# Patient Record
Sex: Male | Born: 1990 | Race: White | Hispanic: No | Marital: Married | Smoking: Former smoker
Health system: Southern US, Community
[De-identification: ages and names within clinical notes are randomized; demographics above are authoritative.]

## PROBLEM LIST (undated history)

## (undated) DIAGNOSIS — F419 Anxiety disorder, unspecified: Secondary | ICD-10-CM

## (undated) HISTORY — PX: ELBOW SURGERY: SHX618

---

## 2015-05-15 ENCOUNTER — Emergency Department (HOSPITAL_COMMUNITY)
Admission: EM | Admit: 2015-05-15 | Discharge: 2015-05-15 | Disposition: A | Discharge status: Home / Self Care | Payer: 59 | Attending: Emergency Medicine | Admitting: Emergency Medicine

## 2015-05-15 ENCOUNTER — Encounter (HOSPITAL_COMMUNITY): Payer: Self-pay | Admitting: *Deleted

## 2015-05-15 ENCOUNTER — Telehealth: Payer: Self-pay | Admitting: Cardiology

## 2015-05-15 ENCOUNTER — Emergency Department (HOSPITAL_COMMUNITY): Payer: 59

## 2015-05-15 DIAGNOSIS — R0602 Shortness of breath: Secondary | ICD-10-CM | POA: Diagnosis not present

## 2015-05-15 DIAGNOSIS — R079 Chest pain, unspecified: Secondary | ICD-10-CM | POA: Insufficient documentation

## 2015-05-15 DIAGNOSIS — F419 Anxiety disorder, unspecified: Secondary | ICD-10-CM | POA: Diagnosis not present

## 2015-05-15 DIAGNOSIS — R002 Palpitations: Secondary | ICD-10-CM | POA: Insufficient documentation

## 2015-05-15 HISTORY — DX: Anxiety disorder, unspecified: F41.9

## 2015-05-15 LAB — BASIC METABOLIC PANEL
Anion gap: 5 (ref 5–15)
BUN: 12 mg/dL (ref 6–20)
CALCIUM: 9.2 mg/dL (ref 8.9–10.3)
CO2: 27 mmol/L (ref 22–32)
CREATININE: 0.84 mg/dL (ref 0.61–1.24)
Chloride: 104 mmol/L (ref 101–111)
GFR calc Af Amer: >60 mL/min (ref >60)
GLUCOSE: 95 mg/dL (ref 65–99)
Potassium: 3.7 mmol/L (ref 3.5–5.1)
SODIUM: 136 mmol/L (ref 135–145)

## 2015-05-15 LAB — CBC
HEMATOCRIT: 43.7 % (ref 39.0–52.0)
Hemoglobin: 15.2 g/dL (ref 13.0–17.0)
MCH: 30.3 pg (ref 26.0–34.0)
MCHC: 34.8 g/dL (ref 30.0–36.0)
MCV: 87.2 fL (ref 78.0–100.0)
Platelets: 226 10*3/uL (ref 150–400)
RBC: 5.01 MIL/uL (ref 4.22–5.81)
RDW: 12.2 % (ref 11.5–15.5)
WBC: 7.2 10*3/uL (ref 4.0–10.5)

## 2015-05-15 LAB — I-STAT TROPONIN, ED: Troponin i, poc: 0 ng/mL (ref 0.00–0.08)

## 2015-05-15 NOTE — ED Notes (Signed)
Pt. Is stable, ambulatory, states understanding of discharge instruction.

## 2015-05-15 NOTE — Telephone Encounter (Signed)
Received records from [facility] for appointment on 05/18/15 with Dr Percival Spanish.  Records given to [facility] (medical records) for Dr Hochrein's schedule on 05/18/15. lp

## 2015-05-15 NOTE — ED Provider Notes (Signed)
CSN: [id number]     Arrival date & time 05/15/15  0355 History   First MD Initiated Contact with Patient 05/15/15 [phone]     Chief Complaint  Patient presents with  . Shortness of Breath  . Palpitations  . Chest Pain     (Consider location/radiation/quality/duration/timing/severity/associated sxs/prior Treatment) Patient is a 24 y.o. male presenting with shortness of breath, palpitations, and chest pain. The history is provided by the patient.  Shortness of Breath Associated symptoms: chest pain   Palpitations Associated symptoms: chest pain and shortness of breath   Chest Pain Associated symptoms: palpitations and shortness of breath   He states that as he is falling asleep, he feels like his heart is beating out of his chest. This is associated with a sense like he needs to belch but can't. This has occurred for the last 3 nights. He also states that it feels like his heart is about to fail. He does have a psychiatric history and his psychiatrist has told him that he can take diazepam as needed but has not taken it for the last 2 weeks. In past, he has had symptoms like this when he has used caffeine or alcohol, but he has abstained from these for the last 3 nights and symptoms are continuing. There is associated dyspnea, nausea. Denies diaphoresis. He is a nonsmoker and denies illicit drug use.  Past Medical History  Diagnosis Date  . Anxiety    Past Surgical History  Procedure Laterality Date  . Elbow surgery     No family history on file. History  Substance Use Topics  . Smoking status: Never Smoker   . Smokeless tobacco: Not on file  . Alcohol Use: Yes    Review of Systems  Respiratory: Positive for shortness of breath.   Cardiovascular: Positive for chest pain and palpitations.  All other systems reviewed and are negative.     Allergies  Review of patient's allergies indicates no known allergies.  Home Medications   Prior to Admission medications   Not on File    BP 137/72 mmHg  Pulse 67  Temp(Src) 98.4 F (36.9 C) (Oral)  Resp 19  Ht 6\' 2"  (1.88 m)  Wt 215 lb (97.523 kg)  BMI 27.59 kg/m2  SpO2 99% Physical Exam  Nursing note and vitals reviewed.  24 year old male, resting comfortably and in no acute distress. Vital signs are normal. Oxygen saturation is 99%, which is normal. Head is normocephalic and atraumatic. PERRLA, EOMI. Oropharynx is clear. Neck is nontender and supple without adenopathy or JVD. Back is nontender and there is no CVA tenderness. Lungs are clear without rales, wheezes, or rhonchi. Chest is nontender. Heart has regular rate and rhythm without murmur. Abdomen is soft, flat, nontender without masses or hepatosplenomegaly and peristalsis is normoactive. Extremities have no cyanosis or edema, full range of motion is present. Skin is warm and dry without rash. Neurologic: Mental status is normal, cranial nerves are intact, there are no motor or sensory deficits.  ED Course  Procedures (including critical care time) Labs Review Results for orders placed or performed during the hospital encounter of 32/67/12  Basic metabolic panel  Result Value Ref Range   Sodium 136 135 - 145 mmol/L   Potassium 3.7 3.5 - 5.1 mmol/L   Chloride 104 101 - 111 mmol/L   CO2 27 22 - 32 mmol/L   Glucose, Bld 95 65 - 99 mg/dL   BUN 12 6 - 20 mg/dL   Creatinine, Ser  0.84 0.61 - 1.24 mg/dL   Calcium 9.2 8.9 - 10.3 mg/dL   GFR calc non Af Amer >60 >60 mL/min   GFR calc Af Amer >60 >60 mL/min   Anion gap 5 5 - 15  CBC  Result Value Ref Range   WBC 7.2 4.0 - 10.5 K/uL   RBC 5.01 4.22 - 5.81 MIL/uL   Hemoglobin 15.2 13.0 - 17.0 g/dL   HCT 43.7 39.0 - 52.0 %   MCV 87.2 78.0 - 100.0 fL   MCH 30.3 26.0 - 34.0 pg   MCHC 34.8 30.0 - 36.0 g/dL   RDW 12.2 11.5 - 15.5 %   Platelets 226 150 - 400 K/uL  I-stat troponin, ED  Result Value Ref Range   Troponin i, poc 0.00 0.00 - 0.08 ng/mL   Comment 3           Imaging Review Dg Chest 2  View  05/15/2015   CLINICAL DATA:  Acute onset of generalized chest pain and palpitations. Initial encounter.  EXAM: CHEST  2 VIEW  COMPARISON:  None.  FINDINGS: The lungs are well-aerated. Mild vascular congestion is noted. There is no evidence of focal opacification, pleural effusion or pneumothorax.  The heart is normal in size; the mediastinal contour is within normal limits. No acute osseous abnormalities are seen.  IMPRESSION: Mild vascular congestion noted; lungs otherwise clear.   Electronically Signed   By: Garald Balding M.D.   On: 05/15/2015 04:58     EKG Interpretation   Date/Time:  Tuesday May 15 2015 04:10:13 EDT Ventricular Rate:  72 PR Interval:  180 QRS Duration: 98 QT Interval:  378 QTC Calculation: 413 R Axis:   99 Text Interpretation:  Normal sinus rhythm with sinus arrhythmia Rightward  axis ST elevation, consider early repolarization, pericarditis, or injury  Abnormal ECG No old tracing to compare Confirmed by [facility]  MD, Abdul Beirne  ([id number]) on 05/15/2015 4:22:22 AM      MDM   Final diagnoses:  Palpitations    Palpitations with on basically normal ECG and normal rhythm on monitor. This may be some version of night terrors. I discussed with patient that Holter monitor or event monitor would be best test to find out what his heart rhythm is when these episodes are actually happening. Will get screening labs in the ED but I doubt they will be informative.  As expected, laboratory workup is unremarkable. He is referred to cardiology for consideration for outpatient Holter or event monitor testing.``  Delora Fuel, MD 10/14/30 [id number]

## 2015-05-15 NOTE — ED Notes (Signed)
Water given with md authorization

## 2015-05-15 NOTE — ED Notes (Signed)
Patient states he has had chest pain, palpitations and sob for 3 nights.  Tonight he felt pressure in his chest like he needed to belch.  Patient states he was unable to rest.  Patient states for the past 3 nights he has not been able to rest like usual due to sx.  Patient states his pain was 10/10 earlier.  He has less pain now.  Patient states in the past he noted episodes related to caffiene activity, stress, etoh but he states he has not had any of the above in the past 3 nights.  Patient is alert.  Skin warm and dry.  Denies n/v/d.

## 2015-05-15 NOTE — Discharge Instructions (Signed)

## 2015-05-17 ENCOUNTER — Ambulatory Visit (INDEPENDENT_AMBULATORY_CARE_PROVIDER_SITE_OTHER): Payer: 59 | Admitting: Cardiology

## 2015-05-17 ENCOUNTER — Encounter: Payer: Self-pay | Admitting: Cardiology

## 2015-05-17 VITALS — BP 110/60 | HR 88 | Ht 74.0 in | Wt 215.2 lb

## 2015-05-17 DIAGNOSIS — R002 Palpitations: Secondary | ICD-10-CM

## 2015-05-17 LAB — TSH: TSH: 1.048 u[IU]/mL (ref 0.350–4.500)

## 2015-05-17 MED ORDER — PROPRANOLOL HCL 10 MG PO TABS: ORAL_TABLET | ORAL | Status: DC

## 2015-05-17 NOTE — Progress Notes (Signed)
Cardiology Office Note   Date:  05/17/2015   ID:  Kyle Ward, DOB [birth year], MRN [id number]  PCP:  No PCP Per Patient  Cardiologist:   Minus Breeding, MD   Chief Complaint  Patient presents with  . Palpitations  . Shortness of Breath      History of Present Illness: Kyle Ward is a 24 y.o. male who presents for evaluation of palpitations. He has had a workup past when he lived in [place] and I was able to review these records area in 2013 he had an echocardiogram which was normal. He's had a 24-hour Holter. She has a history of anxiety which is actively manage area is Holter demonstrated rare ECs but otherwise was unremarkable.  He was in the emergency room recently after having 3 nights of increased heart rate. He starts to go to sleep and he feels a rapid heart rate and a strong heartbeat. This was last at 12 hour period he finally went to the emergency room but by the time he was there his symptoms had abated. I reviewed emergency room records.  His labs were unremarkable.  He had no dysrhythmias.  He otherwise is active. He can physically exert himself doesn't exercise routinely without bringing on symptoms. He does not have chest pressure, neck or arm discomfort. He does not have palpitations, presyncope or syncope. He has no PND or orthopnea. He has no weight gain or edema. He does have continued significant problems with anxiety. He has been given Valium which she takes when necessary has been helping him sleep the last couple of nights. He's been given propranolol in the past but he didn't want to take it because he was afraid his heart might stop.   Past Medical History  Diagnosis Date  . Anxiety     Past Surgical History  Procedure Laterality Date  . Elbow surgery       Current Outpatient Prescriptions  Medication Sig Dispense Refill  . diazepam (VALIUM) 5 MG tablet Take 2.5-5 mg by mouth every 6 (six) hours as needed for anxiety.    . propranolol (INDERAL)  10 MG tablet ONE TABLET THREE TIMES DAILY AS NEEDED FOR PALPITATIONS 90 tablet 6   No current facility-administered medications for this visit.    Allergies:   Review of patient's allergies indicates no known allergies.    Social History:  The patient  reports that he has quit smoking. He does not have any smokeless tobacco history on file. He reports that he drinks alcohol. He reports that he does not use illicit drugs.   Family History:  The patient's family history includes Bipolar disorder in his paternal grandfather; Breast cancer in his mother; Liver disease in his father; Stroke in his maternal grandmother.    ROS:  Please see the history of present illness.   Otherwise, review of systems are positive for none.   All other systems are reviewed and negative.    PHYSICAL EXAM: VS:  BP 110/60 mmHg  Pulse 88  Ht 6\' 2"  (1.88 m)  Wt 215 lb 3.2 oz (97.614 kg)  BMI 27.62 kg/m2 , BMI Body mass index is 27.62 kg/(m^2). GENERAL:  Well appearing HEENT:  Pupils equal round and reactive, fundi not visualized, oral mucosa unremarkable NECK:  No jugular venous distention, waveform within normal limits, carotid upstroke brisk and symmetric, no bruits, no thyromegaly LYMPHATICS:  No cervical, inguinal adenopathy LUNGS:  Clear to auscultation bilaterally BACK:  No CVA tenderness CHEST:  Unremarkable HEART:  PMI not displaced or sustained,S1 and S2 within normal limits, no S3, no S4, no clicks, no rubs, no murmurs ABD:  Flat, positive bowel sounds normal in frequency in pitch, no bruits, no rebound, no guarding, no midline pulsatile mass, no hepatomegaly, no splenomegaly EXT:  2 plus pulses throughout, no edema, no cyanosis no clubbing SKIN:  No rashes no nodules NEURO:  Cranial nerves II through XII grossly intact, motor grossly intact throughout PSYCH:  Cognitively intact, oriented to person place and time    EKG:  EKG is not ordered today. The ekg ordered 05/15/11 demonstrates sinus  rhythm, rate 72, sinus arrhythmia, early repolarization pattern, no acute ST-T wave changes.   Recent Labs: 05/15/2015: BUN 12; Creatinine, Ser 0.84; Hemoglobin 15.2; Platelets 226; Potassium 3.7; Sodium 136    Lipid Panel No results found for: CHOL, TRIG, HDL, CHOLHDL, VLDL, LDLCALC, LDLDIRECT    Wt Readings from Last 3 Encounters:  05/17/15 215 lb 3.2 oz (97.614 kg)  05/15/15 215 lb (97.523 kg)      Other studies Reviewed: Additional studies/ records that were reviewed today include: ED records and previous cardiac work up. Review of the above records demonstrates:  Please see elsewhere in the note.    ASSESSMENT AND PLAN:  PALPITATIONS:  We had a very long discussion about this. 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 I don't strongly suspect a primary arrhythmia as the cause is he's had a complete workup of this in the past. He does note he has some triggers to include caffeine and sugar. We talked about avoiding these. I'm going to give him propranolol when necessary. We talked about management of his anxiety therapy which has previously been suggested. If he gets worse rather than better on going to see him back and has an event monitor or Holter placed. I will draw a TSH as well.   Current medicines are reviewed at length with the patient today.  The patient has concerns regarding medicines.  The following changes have been made:  As above  Labs/ tests ordered today include: None   Orders Placed This Encounter  Procedures  . TSH     Disposition:   FU with me in 2 months.     Signed, Minus Breeding, MD  05/17/2015 1:28 PM    [facility]

## 2015-05-17 NOTE — Patient Instructions (Addendum)
Your physician recommends that you schedule a follow-up appointment in: [address] [place] AS NEEDED FOR PALPITATIONS   Your physician recommends that you HAVE LAB WORK TODAY

## 2015-05-18 ENCOUNTER — Ambulatory Visit: Payer: 59 | Admitting: Cardiology

## 2015-05-29 ENCOUNTER — Ambulatory Visit: Payer: 59 | Admitting: Cardiology

## 2015-07-12 ENCOUNTER — Ambulatory Visit: Payer: 59 | Admitting: Cardiology

## 2015-11-27 IMAGING — CR DG CHEST 2V
2 series · 2 of 2 positions shown · non-contrast
Comparison: None.

CLINICAL DATA: Acute onset of generalized chest pain and
palpitations. Initial encounter.

EXAM:
CHEST  2 VIEW

[chest pa]
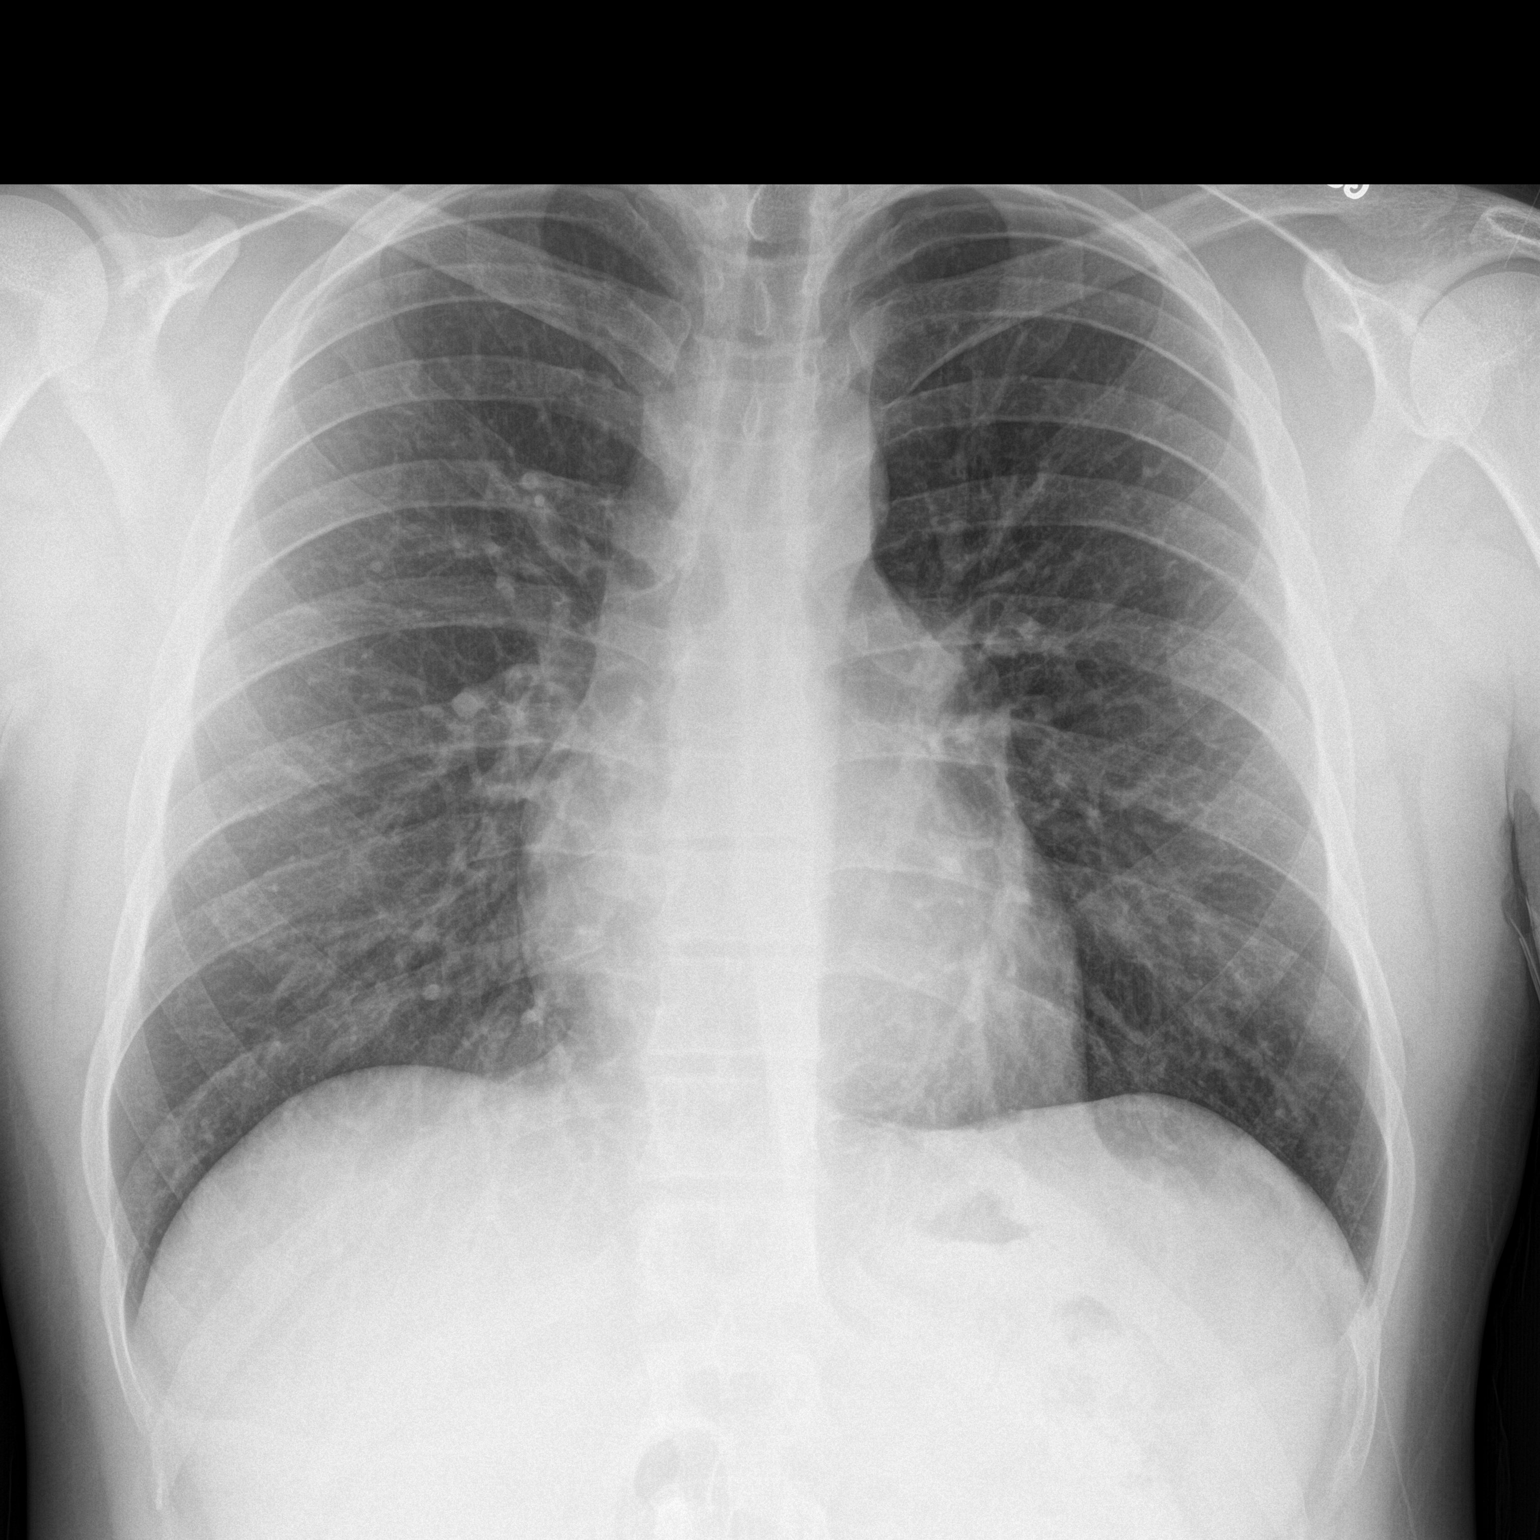

[chest lat]
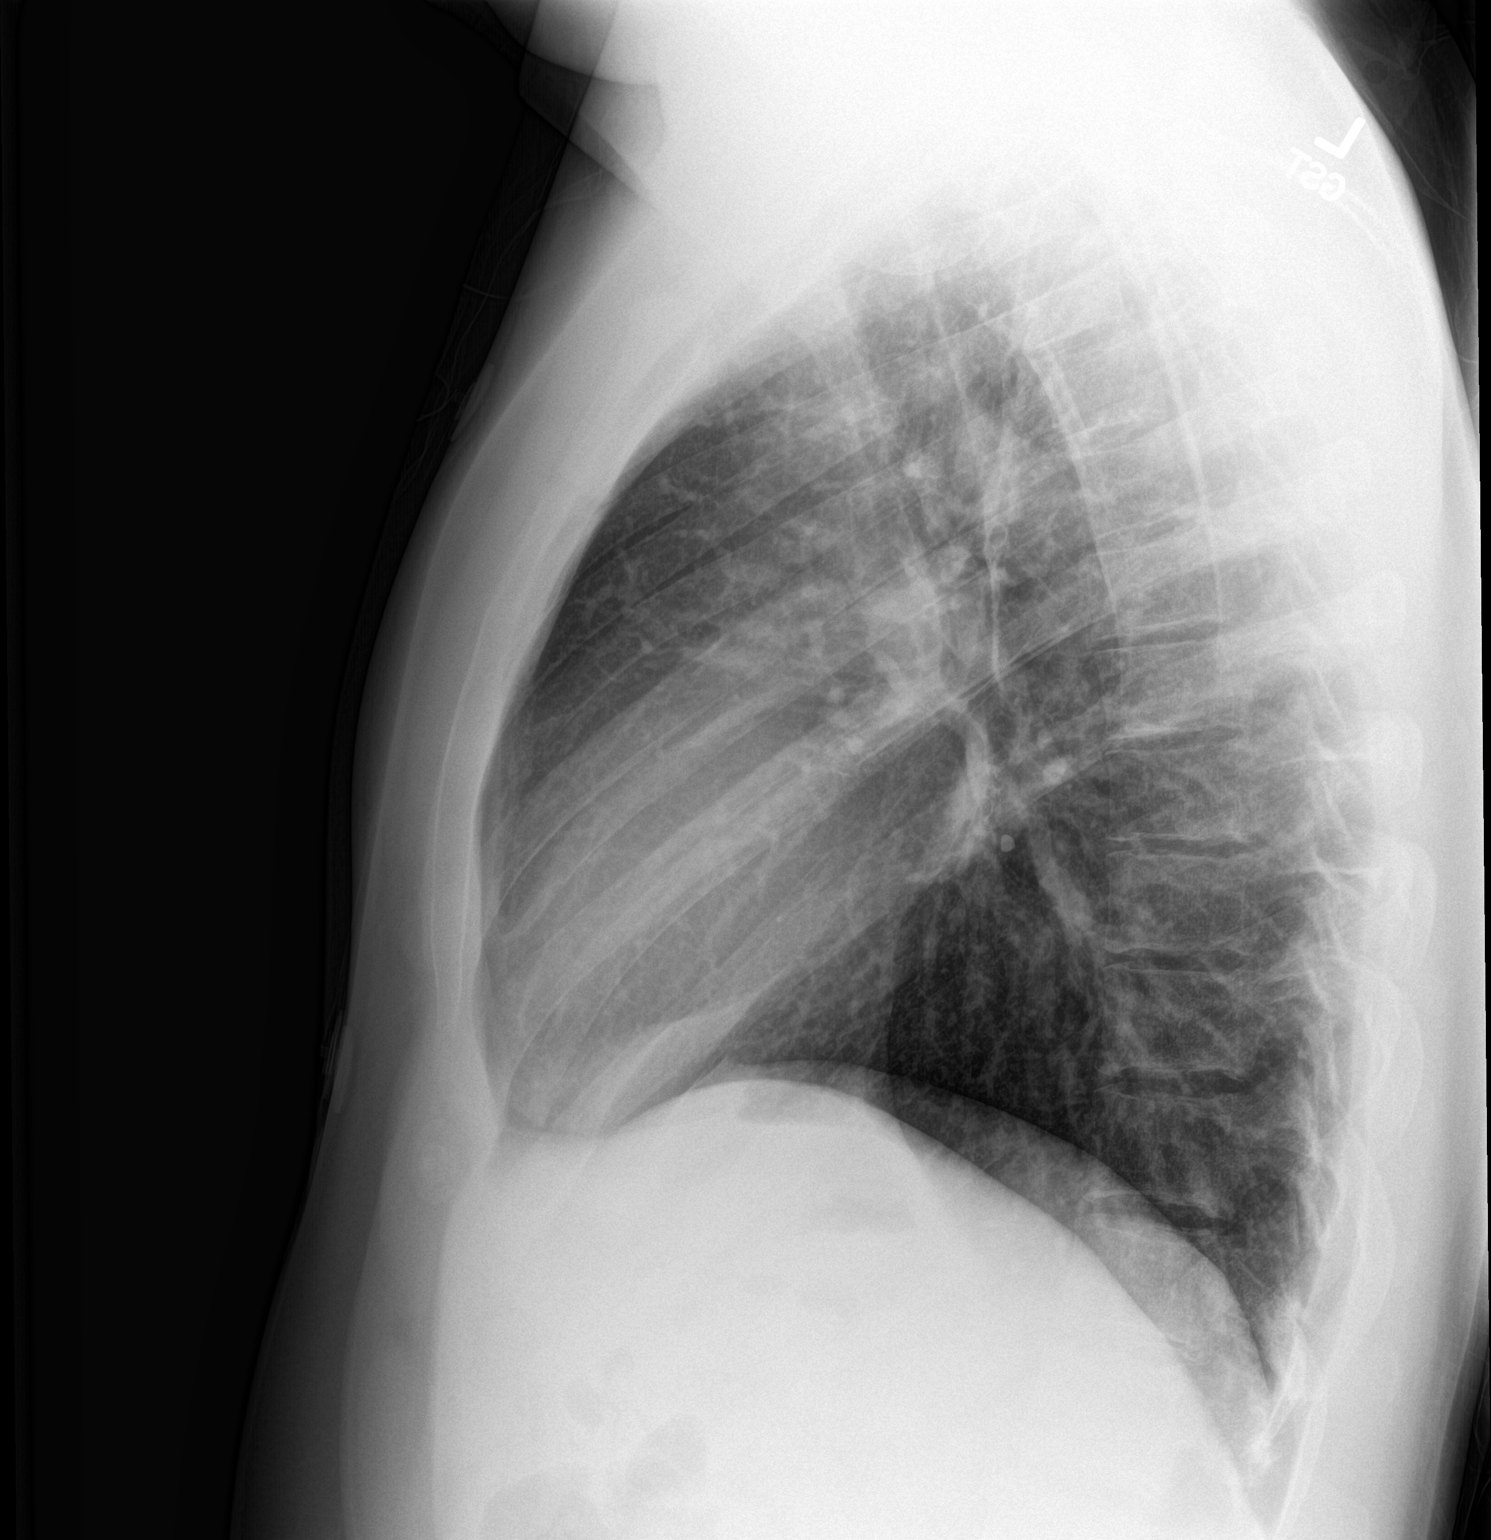

[2 of 2 positions shown; findings below may reference images not displayed]

FINDINGS: The lungs are well-aerated. Mild vascular congestion is noted. There
is no evidence of focal opacification, pleural effusion or
pneumothorax.

The heart is normal in size; the mediastinal contour is within
normal limits. No acute osseous abnormalities are seen.
IMPRESSION: Mild vascular congestion noted; lungs otherwise clear.

## 2018-02-09 DIAGNOSIS — Z1283 Encounter for screening for malignant neoplasm of skin: Secondary | ICD-10-CM | POA: Diagnosis not present

## 2018-02-09 DIAGNOSIS — D225 Melanocytic nevi of trunk: Secondary | ICD-10-CM | POA: Diagnosis not present

## 2018-02-12 DIAGNOSIS — Z0000 Encounter for general adult medical examination without abnormal findings: Secondary | ICD-10-CM | POA: Diagnosis not present

## 2018-05-06 DIAGNOSIS — R5383 Other fatigue: Secondary | ICD-10-CM | POA: Diagnosis not present

## 2018-07-28 DIAGNOSIS — J988 Other specified respiratory disorders: Secondary | ICD-10-CM | POA: Diagnosis not present

## 2019-05-16 ENCOUNTER — Other Ambulatory Visit: Payer: Self-pay

## 2019-05-16 DIAGNOSIS — Z20822 Contact with and (suspected) exposure to covid-19: Secondary | ICD-10-CM

## 2019-05-17 LAB — NOVEL CORONAVIRUS, NAA: SARS-CoV-2, NAA: NOT DETECTED

## 2019-05-30 ENCOUNTER — Other Ambulatory Visit: Payer: Self-pay

## 2019-05-30 DIAGNOSIS — Z20822 Contact with and (suspected) exposure to covid-19: Secondary | ICD-10-CM

## 2019-05-31 LAB — NOVEL CORONAVIRUS, NAA: SARS-CoV-2, NAA: NOT DETECTED

## 2019-06-14 ENCOUNTER — Other Ambulatory Visit: Payer: Self-pay

## 2019-06-14 DIAGNOSIS — Z20822 Contact with and (suspected) exposure to covid-19: Secondary | ICD-10-CM

## 2019-06-16 LAB — NOVEL CORONAVIRUS, NAA: SARS-CoV-2, NAA: NOT DETECTED

## 2019-08-03 ENCOUNTER — Other Ambulatory Visit: Payer: Self-pay

## 2019-08-03 DIAGNOSIS — Z20822 Contact with and (suspected) exposure to covid-19: Secondary | ICD-10-CM

## 2019-08-04 LAB — NOVEL CORONAVIRUS, NAA: SARS-CoV-2, NAA: DETECTED — AB

## 2019-08-05 ENCOUNTER — Telehealth: Payer: Self-pay | Admitting: Critical Care Medicine

## 2019-08-05 ENCOUNTER — Telehealth: Payer: Self-pay

## 2019-08-05 NOTE — Telephone Encounter (Signed)
Result has been routed to Dr. Doreene Burke for critical result. Patient has not been notified

## 2019-08-05 NOTE — Telephone Encounter (Signed)
-----   Message from Kendrick Ranch, RN sent at 08/05/2019 10:10 AM EDT -----  ----- Message ----- From: Lavone Neri Lab Results In Sent: 08/04/2019  11:37 AM EDT To: Mobile Screening Testing Result Pool

## 2019-08-05 NOTE — Telephone Encounter (Addendum)
  I connected with+ this patient on October 30.  The patient had a Covid test October 28 that was positive.  The patient has had loss of smell night sweats no fever but severe fatigue some dyspnea on exertion but no cough no headache.  The patient actually has been to [facility] urgent care had a rapid positive test on October 29 and was prescribed prednisone azithromycin and hydroxyzine.  The patient wished to confirm this so he came to our testing event on October 28 and again tested positive.  I went over with the patient the need that he will need to stay in isolation until November 7.  He has been in isolation up to now.  The patient does work from home.  His girlfriend has been exposed and she is being tested.  The patient knows the health department be in touch.  I did recommend the patient start vitamin C vitamin D and zinc.  The patient will go to the urgent care or emergency room if his symptoms worsen.  Patient knows the health department may be in touch.

## 2019-08-29 ENCOUNTER — Other Ambulatory Visit: Payer: Self-pay

## 2019-08-29 DIAGNOSIS — Z20822 Contact with and (suspected) exposure to covid-19: Secondary | ICD-10-CM

## 2019-08-31 LAB — NOVEL CORONAVIRUS, NAA: SARS-CoV-2, NAA: NOT DETECTED

## 2019-10-20 ENCOUNTER — Other Ambulatory Visit: Payer: Self-pay

## 2019-10-21 ENCOUNTER — Encounter: Payer: Self-pay | Admitting: Family Medicine

## 2019-10-21 ENCOUNTER — Ambulatory Visit (INDEPENDENT_AMBULATORY_CARE_PROVIDER_SITE_OTHER): Payer: 59 | Admitting: Family Medicine

## 2019-10-21 VITALS — BP 128/86 | HR 81 | Temp 98.2°F | Ht 73.5 in | Wt 207.8 lb

## 2019-10-21 DIAGNOSIS — M25511 Pain in right shoulder: Secondary | ICD-10-CM

## 2019-10-21 DIAGNOSIS — Z0000 Encounter for general adult medical examination without abnormal findings: Secondary | ICD-10-CM

## 2019-10-21 DIAGNOSIS — G8929 Other chronic pain: Secondary | ICD-10-CM | POA: Insufficient documentation
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
## 2019-10-21 NOTE — Patient Instructions (Signed)
Health Maintenance, Male Adopting a healthy lifestyle and getting preventive care are important in promoting health and wellness. Ask your health care provider about:  The right schedule for you to have regular tests and exams.  Things you can do on your own to prevent diseases and keep yourself healthy. What should I know about diet, weight, and exercise? Eat a healthy diet   Eat a diet that includes plenty of vegetables, fruits, low-fat dairy products, and lean protein.  Do not eat a lot of foods that are high in solid fats, added sugars, or sodium. Maintain a healthy weight Body mass index (BMI) is a measurement that can be used to identify possible weight problems. It estimates body fat based on height and weight. Your health care provider can help determine your BMI and help you achieve or maintain a healthy weight. Get regular exercise Get regular exercise. This is one of the most important things you can do for your health. Most adults should:  Exercise for at least 150 minutes each week. The exercise should increase your heart rate and make you sweat (moderate-intensity exercise).  Do strengthening exercises at least twice a week. This is in addition to the moderate-intensity exercise.  Spend less time sitting. Even light physical activity can be beneficial. Watch cholesterol and blood lipids Have your blood tested for lipids and cholesterol at 30 years of age, then have this test every 5 years. You may need to have your cholesterol levels checked more often if:  Your lipid or cholesterol levels are high.  You are older than 30 years of age.  You are at high risk for heart disease. What should I know about cancer screening? Many types of cancers can be detected early and may often be prevented. Depending on your health history and family history, you may need to have cancer screening at various ages. This may include screening for:  Colorectal cancer.  Prostate  cancer.  Skin cancer.  Lung cancer. What should I know about heart disease, diabetes, and high blood pressure? Blood pressure and heart disease  High blood pressure causes heart disease and increases the risk of stroke. This is more likely to develop in people who have high blood pressure readings, are of African descent, or are overweight.  Talk with your health care provider about your target blood pressure readings.  Have your blood pressure checked: ? Every 3-5 years if you are 30-39 years of age. ? Every year if you are 40 years old or older.  If you are between the ages of 65 and 75 and are a current or former smoker, ask your health care provider if you should have a one-time screening for abdominal aortic aneurysm (AAA). Diabetes Have regular diabetes screenings. This checks your fasting blood sugar level. Have the screening done:  Once every three years after age 30 if you are at a normal weight and have a low risk for diabetes.  More often and at a younger age if you are overweight or have a high risk for diabetes. What should I know about preventing infection? Hepatitis B If you have a higher risk for hepatitis B, you should be screened for this virus. Talk with your health care provider to find out if you are at risk for hepatitis B infection. Hepatitis C Blood testing is recommended for:  Everyone born from 1945 through 1965.  Anyone with known risk factors for hepatitis C. Sexually transmitted infections (STIs)  You should be screened each year   for STIs, including gonorrhea and chlamydia, if: ? You are sexually active and are younger than 30 years of age. ? You are older than 30 years of age and your health care provider tells you that you are at risk for this type of infection. ? Your sexual activity has changed since you were last screened, and you are at increased risk for chlamydia or gonorrhea. Ask your health care provider if you are at risk.  Ask your  health care provider about whether you are at high risk for HIV. Your health care provider may recommend a prescription medicine to help prevent HIV infection. If you choose to take medicine to prevent HIV, you should first get tested for HIV. You should then be tested every 3 months for as long as you are taking the medicine. Follow these instructions at home: Lifestyle  Do not use any products that contain nicotine or tobacco, such as cigarettes, e-cigarettes, and chewing tobacco. If you need help quitting, ask your health care provider.  Do not use street drugs.  Do not share needles.  Ask your health care provider for help if you need support or information about quitting drugs. Alcohol use  Do not drink alcohol if your health care provider tells you not to drink.  If you drink alcohol: ? Limit how much you have to 0-2 drinks a day. ? Be aware of how much alcohol is in your drink. In the [place] one drink equals one 12 oz bottle of beer (355 mL), one 5 oz glass of wine (148 mL), or one 1 oz glass of hard liquor (44 mL). General instructions  Schedule regular health, dental, and eye exams.  Stay current with your vaccines.  Tell your health care provider if: ? You often feel depressed. ? You have ever been abused or do not feel safe at home. Summary  Adopting a healthy lifestyle and getting preventive care are important in promoting health and wellness.  Follow your health care provider's instructions about healthy diet, exercising, and getting tested or screened for diseases.  Follow your health care provider's instructions on monitoring your cholesterol and blood pressure. This information is not intended to replace advice given to you by your health care provider. Make sure you discuss any questions you have with your health care provider. Document Revised: 09/15/2018 Document Reviewed: 09/15/2018 Elsevier Patient Education  2020 Elsevier Inc.  Preventive Care 21-39 Years  Old, Male Preventive care refers to lifestyle choices and visits with your health care provider that can promote health and wellness. This includes:  A yearly physical exam. This is also called an annual well check.  Regular dental and eye exams.  Immunizations.  Screening for certain conditions.  Healthy lifestyle choices, such as eating a healthy diet, getting regular exercise, not using drugs or products that contain nicotine and tobacco, and limiting alcohol use. What can I expect for my preventive care visit? Physical exam Your health care provider will check:  Height and weight. These may be used to calculate body mass index (BMI), which is a measurement that tells if you are at a healthy weight.  Heart rate and blood pressure.  Your skin for abnormal spots. Counseling Your health care provider may ask you questions about:  Alcohol, tobacco, and drug use.  Emotional well-being.  Home and relationship well-being.  Sexual activity.  Eating habits.  Work and work environment. What immunizations do I need?  Influenza (flu) vaccine  This is recommended every year. Tetanus, diphtheria,   and pertussis (Tdap) vaccine  You may need a Td booster every 10 years. Varicella (chickenpox) vaccine  You may need this vaccine if you have not already been vaccinated. Human papillomavirus (HPV) vaccine  If recommended by your health care provider, you may need three doses over 6 months. Measles, mumps, and rubella (MMR) vaccine  You may need at least one dose of MMR. You may also need a second dose. Meningococcal conjugate (MenACWY) vaccine  One dose is recommended if you are 73-28 years old and a Market researcher living in a residence hall, or if you have one of several medical conditions. You may also need additional booster doses. Pneumococcal conjugate (PCV13) vaccine  You may need this if you have certain conditions and were not previously  vaccinated. Pneumococcal polysaccharide (PPSV23) vaccine  You may need one or two doses if you smoke cigarettes or if you have certain conditions. Hepatitis A vaccine  You may need this if you have certain conditions or if you travel or work in places where you may be exposed to hepatitis A. Hepatitis B vaccine  You may need this if you have certain conditions or if you travel or work in places where you may be exposed to hepatitis B. Haemophilus influenzae type b (Hib) vaccine  You may need this if you have certain risk factors. You may receive vaccines as individual doses or as more than one vaccine together in one shot (combination vaccines). Talk with your health care provider about the risks and benefits of combination vaccines. What tests do I need? Blood tests  Lipid and cholesterol levels. These may be checked every 5 years starting at age 80.  Hepatitis C test.  Hepatitis B test. Screening   Diabetes screening. This is done by checking your blood sugar (glucose) after you have not eaten for a while (fasting).  Sexually transmitted disease (STD) testing. Talk with your health care provider about your test results, treatment options, and if necessary, the need for more tests. Follow these instructions at home: Eating and drinking   Eat a diet that includes fresh fruits and vegetables, whole grains, lean protein, and low-fat dairy products.  Take vitamin and mineral supplements as recommended by your health care provider.  Do not drink alcohol if your health care provider tells you not to drink.  If you drink alcohol: ? Limit how much you have to 0-2 drinks a day. ? Be aware of how much alcohol is in your drink. In the [place] one drink equals one 12 oz bottle of beer (355 mL), one 5 oz glass of wine (148 mL), or one 1 oz glass of hard liquor (44 mL). Lifestyle  Take daily care of your teeth and gums.  Stay active. Exercise for at least 30 minutes on 5 or more days  each week.  Do not use any products that contain nicotine or tobacco, such as cigarettes, e-cigarettes, and chewing tobacco. If you need help quitting, ask your health care provider.  If you are sexually active, practice safe sex. Use a condom or other form of protection to prevent STIs (sexually transmitted infections). What's next?  Go to your health care provider once a year for a well check visit.  Ask your health care provider how often you should have your eyes and teeth checked.  Stay up to date on all vaccines. This information is not intended to replace advice given to you by your health care provider. Make sure you discuss any questions you  have with your health care provider. Document Revised: 09/16/2018 Document Reviewed: 09/16/2018 Elsevier Patient Education  2020 Elsevier Inc.  

## 2019-10-26 ENCOUNTER — Encounter: Payer: Self-pay | Admitting: Family Medicine

## 2019-10-26 ENCOUNTER — Ambulatory Visit: Payer: Self-pay

## 2019-10-26 ENCOUNTER — Ambulatory Visit (INDEPENDENT_AMBULATORY_CARE_PROVIDER_SITE_OTHER): Payer: 59 | Admitting: Family Medicine

## 2019-10-26 ENCOUNTER — Other Ambulatory Visit: Payer: Self-pay

## 2019-10-26 VITALS — BP 120/74 | Ht 73.0 in | Wt 205.0 lb

## 2019-10-26 DIAGNOSIS — M25511 Pain in right shoulder: Secondary | ICD-10-CM

## 2019-10-26 DIAGNOSIS — S46811A Strain of other muscles, fascia and tendons at shoulder and upper arm level, right arm, initial encounter: Secondary | ICD-10-CM

## 2019-10-26 DIAGNOSIS — G8929 Other chronic pain: Secondary | ICD-10-CM

## 2019-10-26 MED ORDER — METHYLPREDNISOLONE ACETATE 40 MG/ML IJ SUSP
40.0000 mg | Freq: Once | INTRAMUSCULAR | Status: AC
  Administered 2019-10-26: 40 mg via INTRA_ARTICULAR

## 2019-10-26 MED ORDER — MELOXICAM 15 MG PO TABS: 7.5000 mg | ORAL_TABLET | Freq: Every day | ORAL | 2 refills | Status: DC | PRN

## 2019-10-26 NOTE — Patient Instructions (Signed)
Your shoulder pain is caused by spasms of your trapezius from scapular muscle dysfunction -We will send you to physical therapy to help strengthen the muscles surrounding her scapula to help reduce the popping and improve your pain -The trigger point injection should help with some of the discomfort you have been having -We will see you back in 4 to 6 weeks if you are still having pain.  At that time if you are not having any improvement we will consider getting an MRI of your neck to check for a pinched nerve
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
## 2019-11-07 ENCOUNTER — Ambulatory Visit: Payer: 59 | Attending: Internal Medicine

## 2019-11-07 DIAGNOSIS — Z20822 Contact with and (suspected) exposure to covid-19: Secondary | ICD-10-CM

## 2019-11-08 ENCOUNTER — Encounter: Payer: Self-pay | Admitting: Physical Therapy

## 2019-11-08 ENCOUNTER — Other Ambulatory Visit: Payer: Self-pay

## 2019-11-08 ENCOUNTER — Ambulatory Visit: Payer: 59 | Attending: Family Medicine | Admitting: Physical Therapy

## 2019-11-08 DIAGNOSIS — R252 Cramp and spasm: Secondary | ICD-10-CM | POA: Diagnosis present

## 2019-11-08 DIAGNOSIS — M542 Cervicalgia: Secondary | ICD-10-CM | POA: Diagnosis present

## 2019-11-08 DIAGNOSIS — M25511 Pain in right shoulder: Secondary | ICD-10-CM

## 2019-11-08 LAB — NOVEL CORONAVIRUS, NAA: SARS-CoV-2, NAA: NOT DETECTED
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
## 2019-11-08 NOTE — Patient Instructions (Signed)
Access Code: [email or web address]  URL: [email or web address]  Date: 11/08/2019  Prepared by: Lum Babe   Exercises Seated Shoulder Shrugs - 10 reps - 1 sets - 3 hold - 2x daily - 7x weekly Seated Scapular Retraction - 10 reps - 1 sets - 3 hold - 2x daily - 7x weekly Scapular Retraction with Resistance - 10 reps - 2 sets - 2 hold - 2x daily - 7x weekly Scapular Retraction with Resistance Advanced - 10 reps - 2 sets - 3 hold - 2x daily - 7x weekly  Trigger Point Dry Needling  . What is Trigger Point Dry Needling (DN)? o DN is a physical therapy technique used to treat muscle pain and dysfunction. Specifically, DN helps deactivate muscle trigger points (muscle knots).  o A thin filiform needle is used to penetrate the skin and stimulate the underlying trigger point. The goal is for a local twitch response (LTR) to occur and for the trigger point to relax. No medication of any kind is injected during the procedure.   . What Does Trigger Point Dry Needling Feel Like?  o The procedure feels different for each individual patient. Some patients report that they do not actually feel the needle enter the skin and overall the process is not painful. Very mild bleeding may occur. However, many patients feel a deep cramping in the muscle in which the needle was inserted. This is the local twitch response.   Marland Kitchen How Will I feel after the treatment? o Soreness is normal, and the onset of soreness may not occur for a few hours. Typically this soreness does not last longer than two days.  o Bruising is uncommon, however; ice can be used to decrease any possible bruising.  o In rare cases feeling tired or nauseous after the treatment is normal. In addition, your symptoms may get worse before they get better, this period will typically not last longer than 24 hours.   . What Can I do After My Treatment? o Increase your hydration by drinking more water for the next 24 hours. o You may place ice or  heat on the areas treated that have become sore, however, do not use heat on inflamed or bruised areas. Heat often brings more relief post needling. o You can continue your regular activities, but vigorous activity is not recommended initially after the treatment for 24 hours. o DN is best combined with other physical therapy such as strengthening, stretching, and other therapies.

## 2019-11-11 ENCOUNTER — Ambulatory Visit: Payer: 59 | Attending: Internal Medicine

## 2019-11-11 DIAGNOSIS — Z20822 Contact with and (suspected) exposure to covid-19: Secondary | ICD-10-CM

## 2019-11-12 LAB — NOVEL CORONAVIRUS, NAA: SARS-CoV-2, NAA: NOT DETECTED

## 2019-11-15 ENCOUNTER — Other Ambulatory Visit: Payer: Self-pay

## 2019-11-15 ENCOUNTER — Ambulatory Visit: Payer: 59 | Admitting: Physical Therapy

## 2019-11-15 ENCOUNTER — Encounter: Payer: Self-pay | Admitting: Physical Therapy

## 2019-11-15 DIAGNOSIS — M25511 Pain in right shoulder: Secondary | ICD-10-CM

## 2019-11-15 DIAGNOSIS — R252 Cramp and spasm: Secondary | ICD-10-CM

## 2019-11-15 DIAGNOSIS — M542 Cervicalgia: Secondary | ICD-10-CM

## 2019-11-15 NOTE — Therapy (Signed)
[place], [place], [address] Phone: [phone]   Fax:  [phone]  Physical Therapy Treatment  Patient Details  Name: DIOMEDES ORRIS MRN: [id number] Date of Birth: [birth year] Referring Provider (PT): Hudnall   Encounter Date: 11/15/2019  PT End of Session - 11/15/19 1510    Visit Number  2    Date for PT Re-Evaluation  01/06/20    PT Start Time  1430    PT Stop Time  1513    PT Time Calculation (min)  43 min    Activity Tolerance  Patient tolerated treatment well    Behavior During Therapy  [facility] for tasks assessed/performed       Past Medical History:  Diagnosis Date  . Anxiety     Past Surgical History:  Procedure Laterality Date  . ELBOW SURGERY      There were no vitals filed for this visit.  Subjective Assessment - 11/15/19 1432    Subjective  No changes since evaluation. The "Trigger shot did help"    Currently in Pain?  No/denies                       [facility] Adult PT Treatment/Exercise - 11/15/19 0001      Posture/Postural Control   Posture Comments  some rounded shoulder, fwd head is slight      Exercises   Exercises  Lumbar      Lumbar Exercises: Aerobic   Nustep  L5 x 6 min       Lumbar Exercises: Standing   Row  Theraband;20 reps;Strengthening;Both    Theraband Level (Row)  Level 3 (Green)    Shoulder Extension  Theraband;20 reps;Both    Other Standing Lumbar Exercises  ER red 2x10     Other Standing Lumbar Exercises  Shrugs 6lb 2x10       Modalities   Modalities  Electrical Stimulation      Electrical Stimulation   Electrical Stimulation Location  right upper trap and into the rhomboid    Electrical Stimulation Action  [place]?Estim Combo    Electrical Stimulation Parameters  sitting    Electrical Stimulation Goals  Tone;Pain               PT Short Term Goals - 11/08/19 1711      PT SHORT TERM GOAL #1   Title  independent with  initial HEP    Time  2    Period  Weeks    Status  New        PT Long Term Goals - 11/08/19 1712      PT LONG TERM GOAL #1   Title  understand posture and body mechanics    Time  8    Period  Weeks    Status  New      PT LONG TERM GOAL #2   Title  report pain decreased 50%    Time  8    Period  Weeks    Status  New      PT LONG TERM GOAL #3   Title  increase cervical sidebending to the left to WNL's    Time  8    Period  Weeks    Status  New      PT LONG TERM GOAL #4   Title  independent with advanced HEP or gym    Time  8    Period  Weeks  Status  New            Plan - 11/15/19 1511    Clinical Impression Statement  Pt tolerated an initial  progression to TE well. Postural cues needed with shoulder rows and extensions. Pt has some scapular popping with shrugs. No reports of pain. Positive response to Modalities.    Stability/Clinical Decision Making  Stable/Uncomplicated    Rehab Potential  Good    PT Frequency  2x / week    PT Duration  8 weeks    PT Treatment/Interventions  ADLs/Self Care Home Management;Electrical Stimulation;Iontophoresis 4mg /ml Dexamethasone;Moist Heat;Traction;Ultrasound;Cryotherapy;Manual techniques;Dry needling;Therapeutic activities;Therapeutic exercise;Neuromuscular re-education;Patient/family education    PT Next Visit Plan  start gym for scapular stability, could try dry needling       Patient will benefit from skilled therapeutic intervention in order to improve the following deficits and impairments:  Improper body mechanics, Pain, Postural dysfunction, Increased muscle spasms, Decreased range of motion  Visit Diagnosis: Cramp and spasm  Acute pain of right shoulder  Cervicalgia     Problem List Patient Active Problem List   Diagnosis Date Noted  . Healthcare maintenance 10/21/2019  . Chronic right shoulder pain 10/21/2019  . Palpitations 05/17/2015    Scot Jun, PTA 11/15/2019, 3:13 PM  [place], [place], [address] Phone: [phone]   Fax:  [phone]  Name: ABDELLAH PIEROG MRN: [id number] Date of Birth: August 22, 1991

## 2019-11-17 ENCOUNTER — Ambulatory Visit: Payer: 59 | Admitting: Physical Therapy

## 2019-11-17 ENCOUNTER — Other Ambulatory Visit: Payer: Self-pay

## 2019-11-17 DIAGNOSIS — M25511 Pain in right shoulder: Secondary | ICD-10-CM

## 2019-11-17 DIAGNOSIS — M542 Cervicalgia: Secondary | ICD-10-CM

## 2019-11-17 DIAGNOSIS — R252 Cramp and spasm: Secondary | ICD-10-CM

## 2019-11-17 NOTE — Therapy (Signed)
[place], [place], [address] Phone: [phone]   Fax:  [phone]  Physical Therapy Treatment  Patient Details  Name: Kyle Ward MRN: [id number] Date of Birth: 07/26/1991 Referring Provider (PT): Hudnall   Encounter Date: 11/17/2019  PT End of Session - 11/17/19 1324    Visit Number  3    Date for PT Re-Evaluation  01/06/20    PT Start Time  1500    PT Stop Time  4010    PT Time Calculation (min)  48 min       Past Medical History:  Diagnosis Date  . Anxiety     Past Surgical History:  Procedure Laterality Date  . ELBOW SURGERY      There were no vitals filed for this visit.  Subjective Assessment - 11/17/19 1703    Subjective  doing okay. saw a chiropractor today for inital visit, they also did estim and ice    Currently in Pain?  Yes    Pain Score  3     Pain Location  Shoulder    Pain Orientation  Right                       [facility] Adult PT Treatment/Exercise - 11/17/19 0001      Lumbar Exercises: Aerobic   UBE (Upper Arm Bike)  56mn fwd/2 backward L 3      Lumbar Exercises: Standing   Other Standing Lumbar Exercises  ball vs wall      Lumbar Exercises: Seated   Other Seated Lumbar Exercises  4# row,shld ext and abd 2 sets 10    Other Seated Lumbar Exercises  4# PNF 2 sets 10      Modalities   Modalities  Electrical Stimulation      Electrical Stimulation   Electrical Stimulation Location  right upper trap and into the rhomboid    Electrical Stimulation Action  US/estim ocmbo    Electrical Stimulation Parameters  sitting    Electrical Stimulation Goals  Tone;Pain               PT Short Term Goals - 11/17/19 1750      PT SHORT TERM GOAL #1   Title  independent with initial HEP    Status  Partially Met        PT Long Term Goals - 11/08/19 1712      PT LONG TERM GOAL #1   Title  understand posture and body mechanics    Time  8     Period  Weeks    Status  New      PT LONG TERM GOAL #2   Title  report pain decreased 50%    Time  8    Period  Weeks    Status  New      PT LONG TERM GOAL #3   Title  increase cervical sidebending to the left to WNL's    Time  8    Period  Weeks    Status  New      PT LONG TERM GOAL #4   Title  independent with advanced HEP or gym    Time  8    Period  Weeks    Status  New            Plan - 11/17/19 1748    Clinical Impression Statement  pt fatigued with ther ex an  dhad some inceased soreness. postural cuing neede with ex. discussed desk and  work ergonomics for better support of UE. added DN for TP    PT Treatment/Interventions  ADLs/Self Care Home Management;Electrical Stimulation;Iontophoresis 43m/ml Dexamethasone;Moist Heat;Traction;Ultrasound;Cryotherapy;Manual techniques;Dry needling;Therapeutic activities;Therapeutic exercise;Neuromuscular re-education;Patient/family education    PT Next Visit Plan  cont scapular stability assess dry needling       Patient will benefit from skilled therapeutic intervention in order to improve the following deficits and impairments:  Improper body mechanics, Pain, Postural dysfunction, Increased muscle spasms, Decreased range of motion  Visit Diagnosis: Acute pain of right shoulder  Cramp and spasm  Cervicalgia     Problem List Patient Active Problem List   Diagnosis Date Noted  . Healthcare maintenance 10/21/2019  . Chronic right shoulder pain 10/21/2019  . Palpitations 05/17/2015    PAYSEUR,ANGIE PTA 11/17/2019, 5:50 PM  [address] [address]Phone: [phone]  Fax:  [phone] Name: Kyle DETLOFFMRN: [id number] of Birth: [birth year]

## 2019-11-22 ENCOUNTER — Encounter: Payer: Self-pay | Admitting: Physical Therapy

## 2019-11-22 ENCOUNTER — Other Ambulatory Visit: Payer: Self-pay

## 2019-11-22 ENCOUNTER — Ambulatory Visit: Payer: 59 | Admitting: Physical Therapy

## 2019-11-22 DIAGNOSIS — M25511 Pain in right shoulder: Secondary | ICD-10-CM

## 2019-11-22 DIAGNOSIS — M542 Cervicalgia: Secondary | ICD-10-CM | POA: Diagnosis not present

## 2019-11-22 DIAGNOSIS — R252 Cramp and spasm: Secondary | ICD-10-CM

## 2019-11-22 NOTE — Therapy (Signed)
[place], [place], [address] Phone: [phone]   Fax:  [phone]  Physical Therapy Treatment  Patient Details  Name: Kyle Ward MRN: [id number] Date of Birth: Feb 19, 1991 Referring Provider (PT): Hudnall   Encounter Date: 11/22/2019  PT End of Session - 11/22/19 1515    Visit Number  4    Date for PT Re-Evaluation  01/06/20    PT Start Time  1430    PT Stop Time  1528    PT Time Calculation (min)  58 min    Activity Tolerance  Patient tolerated treatment well    Behavior During Therapy  [facility] for tasks assessed/performed       Past Medical History:  Diagnosis Date  . Anxiety     Past Surgical History:  Procedure Laterality Date  . ELBOW SURGERY      There were no vitals filed for this visit.  Subjective Assessment - 11/22/19 1432    Subjective  Pt reports some improvement overall    Currently in Pain?  No/denies                       [facility] Adult PT Treatment/Exercise - 11/22/19 0001      Lumbar Exercises: Aerobic   UBE (Upper Arm Bike)  2 min fwd/ 2 backward L 3.5      Lumbar Exercises: Machines for Strengthening   Other Lumbar Machine Exercise  Rows and Lats 25lb 2x10    Other Lumbar Machine Exercise  Serratus press 15lb 2x10      Lumbar Exercises: Standing   Other Standing Lumbar Exercises  RUE ER  green 2x10      Lumbar Exercises: Seated   Other Seated Lumbar Exercises  bent over Row & Ext 4lb 2x10        Modalities   Modalities  Electrical Stimulation;Moist Heat      Moist Heat Therapy   Number Minutes Moist Heat  15 Minutes    Moist Heat Location  Shoulder      Electrical Stimulation   Electrical Stimulation Location  R shoulder blade     Electrical Stimulation Action  IFC    Electrical Stimulation Parameters  sitting    Electrical Stimulation Goals  Pain       Trigger Point Dry Needling - 11/22/19 0001    Consent Given?  Yes    Muscles  Treated Head and Neck  Upper trapezius;Levator scapulae    Upper Trapezius Response  Twitch reponse elicited    Levator Scapulae Response  Twitch response elicited             PT Short Term Goals - 11/17/19 1750      PT SHORT TERM GOAL #1   Title  independent with initial HEP    Status  Partially Met        PT Long Term Goals - 11/08/19 1712      PT LONG TERM GOAL #1   Title  understand posture and body mechanics    Time  8    Period  Weeks    Status  New      PT LONG TERM GOAL #2   Title  report pain decreased 50%    Time  8    Period  Weeks    Status  New      PT LONG TERM GOAL #3   Title  increase cervical sidebending to the  left to WNL's    Time  8    Period  Weeks    Status  New      PT LONG TERM GOAL #4   Title  independent with advanced HEP or gym    Time  8    Period  Weeks    Status  New            Plan - 11/22/19 1516    Clinical Impression Statement  Focused on scapular stabilization exercises. He reports a popping sensation in the R scapula with bent over rows. Progressed to machine level interventions without issue. Cues given throughout to squeeze scapulas together.    Stability/Clinical Decision Making  Stable/Uncomplicated    Rehab Potential  Good    PT Frequency  2x / week    PT Duration  8 weeks    PT Treatment/Interventions  ADLs/Self Care Home Management;Electrical Stimulation;Iontophoresis 28m/ml Dexamethasone;Moist Heat;Traction;Ultrasound;Cryotherapy;Manual techniques;Dry needling;Therapeutic activities;Therapeutic exercise;Neuromuscular re-education;Patient/family education    PT Next Visit Plan  cont scapular stability assess dry needling       Patient will benefit from skilled therapeutic intervention in order to improve the following deficits and impairments:  Improper body mechanics, Pain, Postural dysfunction, Increased muscle spasms, Decreased range of motion  Visit Diagnosis: Acute pain of right shoulder  Cramp and  spasm  Cervicalgia     Problem List Patient Active Problem List   Diagnosis Date Noted  . Healthcare maintenance 10/21/2019  . Chronic right shoulder pain 10/21/2019  . Palpitations 05/17/2015    RScot Jun PTA 11/22/2019, 3:22 PM  [place] [address]Phone: [phone]  Fax:  [phone] Name: Kyle VICTORIOMRN: [id number] of Birth: [birth year]

## 2019-11-24 ENCOUNTER — Ambulatory Visit: Payer: 59 | Admitting: Physical Therapy

## 2019-11-29 ENCOUNTER — Ambulatory Visit: Payer: 59 | Admitting: Physical Therapy

## 2019-11-29 ENCOUNTER — Other Ambulatory Visit: Payer: Self-pay

## 2019-11-29 ENCOUNTER — Encounter: Payer: Self-pay | Admitting: Physical Therapy

## 2019-11-29 DIAGNOSIS — M542 Cervicalgia: Secondary | ICD-10-CM | POA: Diagnosis not present

## 2019-11-29 DIAGNOSIS — M25511 Pain in right shoulder: Secondary | ICD-10-CM

## 2019-11-29 DIAGNOSIS — R252 Cramp and spasm: Secondary | ICD-10-CM

## 2019-11-29 NOTE — Therapy (Signed)
[place], [place], [address] Phone: [phone]   Fax:  [phone]  Physical Therapy Treatment  Patient Details  Name: Kyle Ward MRN: [id number] Date of Birth: 09/06/1991 Referring Provider (PT): Hudnall   Encounter Date: 11/29/2019  PT End of Session - 11/29/19 1546    Visit Number  5    Date for PT Re-Evaluation  01/06/20    PT Start Time  1430    PT Stop Time  1529    PT Time Calculation (min)  59 min    Activity Tolerance  Patient tolerated treatment well    Behavior During Therapy  [facility] for tasks assessed/performed       Past Medical History:  Diagnosis Date  . Anxiety     Past Surgical History:  Procedure Laterality Date  . ELBOW SURGERY      There were no vitals filed for this visit.  Subjective Assessment - 11/29/19 1437    Subjective  "I feel like it is a little better" still some issues overall    Currently in Pain?  No/denies                       [facility] Adult PT Treatment/Exercise - 11/29/19 0001      Lumbar Exercises: Aerobic   UBE (Upper Arm Bike)  3 min fwd/ 3 backward L 3.5      Lumbar Exercises: Standing   Other Standing Lumbar Exercises  RUE ER  green 2x10, I Y T 3lb x 10 each     Other Standing Lumbar Exercises  RUE D2 flex red 2x10, Archers roe RUE 15lb 2x10       Lumbar Exercises: Seated   Other Seated Lumbar Exercises  bent over Row, Ext, & rev Fly  4lb 2x10        Electrical Stimulation   Electrical Stimulation Location  right upper trap and into the rhomboid    Electrical Stimulation Action  US/estim Combo    Electrical Stimulation Parameters  sitting    Electrical Stimulation Goals  Tone;Pain               PT Short Term Goals - 11/29/19 1553      PT SHORT TERM GOAL #1   Title  independent with initial HEP    Status  Achieved        PT Long Term Goals - 11/29/19 1553      PT LONG TERM GOAL #1   Title   understand posture and body mechanics    Status  Partially Met      PT LONG TERM GOAL #2   Title  report pain decreased 50%    Status  Partially Met            Plan - 11/29/19 1548    Clinical Impression Statement  Continues to focus on scapular stability. Popping in R scapula noted with resisted D2 flexion mostly but occasionally with the other interventions. Reports increase difficulty with rev fly's on the R side compared to L. UE did fatigue with I's, Y's &T's. Cues needed to bring elbow higher up with archers row.    Stability/Clinical Decision Making  Stable/Uncomplicated    Rehab Potential  Good    PT Frequency  2x / week    PT Duration  8 weeks    PT Treatment/Interventions  ADLs/Self Care Home Management;Electrical Stimulation;Iontophoresis 41m/ml Dexamethasone;Moist Heat;Traction;Ultrasound;Cryotherapy;Manual techniques;Dry needling;Therapeutic activities;Therapeutic  exercise;Neuromuscular re-education;Patient/family education    PT Next Visit Plan  cont scapular stability continue dry needling       Patient will benefit from skilled therapeutic intervention in order to improve the following deficits and impairments:  Improper body mechanics, Pain, Postural dysfunction, Increased muscle spasms, Decreased range of motion  Visit Diagnosis: Cramp and spasm  Acute pain of right shoulder  Cervicalgia     Problem List Patient Active Problem List   Diagnosis Date Noted  . Healthcare maintenance 10/21/2019  . Chronic right shoulder pain 10/21/2019  . Palpitations 05/17/2015    Scot Jun, PTA 11/29/2019, 3:53 PM  [place], [place], [address] Phone: [phone]   Fax:  [phone]  Name: Kyle Ward MRN: [id number] Date of Birth: [birth year]

## 2019-12-01 ENCOUNTER — Ambulatory Visit: Payer: 59 | Admitting: Physical Therapy

## 2019-12-01 ENCOUNTER — Other Ambulatory Visit: Payer: Self-pay

## 2019-12-01 ENCOUNTER — Encounter: Payer: Self-pay | Admitting: Physical Therapy

## 2019-12-01 DIAGNOSIS — M542 Cervicalgia: Secondary | ICD-10-CM | POA: Diagnosis not present

## 2019-12-01 DIAGNOSIS — M25511 Pain in right shoulder: Secondary | ICD-10-CM

## 2019-12-01 DIAGNOSIS — R252 Cramp and spasm: Secondary | ICD-10-CM

## 2019-12-01 NOTE — Therapy (Signed)
[place], [place], [address] Phone: [phone]   Fax:  [phone]  Physical Therapy Treatment  Patient Details  Name: Kyle Ward MRN: [id number] Date of Birth: 04/29/1991 Referring Provider (PT): Hudnall   Encounter Date: 12/01/2019  PT End of Session - 12/01/19 1509    Visit Number  6    Date for PT Re-Evaluation  01/06/20    PT Start Time  1430    PT Stop Time  1523    PT Time Calculation (min)  53 min    Activity Tolerance  Patient tolerated treatment well    Behavior During Therapy  [facility] for tasks assessed/performed       Past Medical History:  Diagnosis Date  . Anxiety     Past Surgical History:  Procedure Laterality Date  . ELBOW SURGERY      There were no vitals filed for this visit.  Subjective Assessment - 12/01/19 1436    Subjective  "Just left the chiropractor, So Im feeling loose"    Currently in Pain?  No/denies                       [facility] Adult PT Treatment/Exercise - 12/01/19 0001      Lumbar Exercises: Aerobic   UBE (Upper Arm Bike)  constant work 25 watts 3 min each way       Lumbar Exercises: Machines for Strengthening   Other Lumbar Machine Exercise  Rows and Lats 25lb 2x10      Lumbar Exercises: Standing   Shoulder Extension  20 reps;Both;Power Tower;Strengthening    Shoulder Extension Limitations  10    Other Standing Lumbar Exercises  RUE D2 flex red 2x10, Archers roe RUE 15lb 2x10       Electrical Stimulation   Electrical Stimulation Location  R shoulder blade     Electrical Stimulation Action  IFC    Electrical Stimulation Parameters  prone     Electrical Stimulation Goals  Pain               PT Short Term Goals - 11/29/19 1553      PT SHORT TERM GOAL #1   Title  independent with initial HEP    Status  Achieved        PT Long Term Goals - 11/29/19 1553      PT LONG TERM GOAL #1   Title  understand posture  and body mechanics    Status  Partially Met      PT LONG TERM GOAL #2   Title  report pain decreased 50%    Status  Partially Met            Plan - 12/01/19 1510    Clinical Impression Statement  Less R scapula popping during today's interventions. All exercises performed with an emphasis on setting scapula in the appropriate position before performing each movement. Increase resistance tolerated with rows and plat pull downs. No reports of increase pain. Some popping with reverse on UBE    Stability/Clinical Decision Making  Stable/Uncomplicated    Rehab Potential  Good    PT Frequency  2x / week    PT Duration  8 weeks    PT Treatment/Interventions  ADLs/Self Care Home Management;Electrical Stimulation;Iontophoresis 74m/ml Dexamethasone;Moist Heat;Traction;Ultrasound;Cryotherapy;Manual techniques;Dry needling;Therapeutic activities;Therapeutic exercise;Neuromuscular re-education;Patient/family education    PT Next Visit Plan  cont scapular stability continue dry needling  Patient will benefit from skilled therapeutic intervention in order to improve the following deficits and impairments:  Improper body mechanics, Pain, Postural dysfunction, Increased muscle spasms, Decreased range of motion  Visit Diagnosis: Cramp and spasm  Acute pain of right shoulder  Cervicalgia     Problem List Patient Active Problem List   Diagnosis Date Noted  . Healthcare maintenance 10/21/2019  . Chronic right shoulder pain 10/21/2019  . Palpitations 05/17/2015    Scot Jun 12/01/2019, 3:15 PM  [place], [place], [address] Phone: [phone]   Fax:  [phone]  Name: NYREE YONKER MRN: [id number] Date of Birth: 1991/07/05

## 2019-12-07 ENCOUNTER — Ambulatory Visit (INDEPENDENT_AMBULATORY_CARE_PROVIDER_SITE_OTHER): Payer: 59 | Admitting: Family Medicine

## 2019-12-07 ENCOUNTER — Encounter: Payer: Self-pay | Admitting: Family Medicine

## 2019-12-07 ENCOUNTER — Other Ambulatory Visit: Payer: Self-pay

## 2019-12-07 VITALS — BP 112/80 | Ht 73.0 in | Wt 204.0 lb

## 2019-12-07 DIAGNOSIS — G8929 Other chronic pain: Secondary | ICD-10-CM

## 2019-12-07 DIAGNOSIS — M25511 Pain in right shoulder: Secondary | ICD-10-CM | POA: Diagnosis not present

## 2019-12-07 MED ORDER — METHYLPREDNISOLONE ACETATE 40 MG/ML IJ SUSP
40.0000 mg | Freq: Once | INTRAMUSCULAR | Status: AC
  Administered 2019-12-07: 40 mg via INTRA_ARTICULAR

## 2019-12-07 NOTE — Progress Notes (Signed)
PCP: Libby Maw, MD  Subjective:   HPI: Patient is a 29 y.o. male here for f/u of right sided upper back pain.  Pain continues to be located in right trapezius/scapular area. On last visit on 10/26/19 etiology of pain was suspected to be right trapezius/spasm and was referred to PT to stabilize scapular muscles and trigger point injection was performed.   Today, patient describes moderate relief from pain following trigger point injection. Pain relief lasted for a few weeks but has since returned. He continued utilizing chiropractors, massage therapy, as well as PT multiple times per week in order to try to maintain any relief. Continues to note pain when the shoulder roll back and denies neck pain, numbness or tingling, or pain when reaching above his head/behind back.  Overall mildly improved.  Past Medical History:  Diagnosis Date  . Anxiety     Current Outpatient Medications on File Prior to Visit  Medication Sig Dispense Refill  . meloxicam (MOBIC) 15 MG tablet Take 0.5-1 tablets (7.5-15 mg total) by mouth daily as needed. 30 tablet 2   No current facility-administered medications on file prior to visit.    Past Surgical History:  Procedure Laterality Date  . ELBOW SURGERY      No Known Allergies  Social History   Socioeconomic History  . Marital status: Single    Spouse name: Not on file  . Number of children: Not on file  . Years of education: Not on file  . Highest education level: Not on file  Occupational History  . Not on file  Tobacco Use  . Smoking status: Former Research scientist (life sciences)  . Smokeless tobacco: Never Used  . Tobacco comment: Smoked during college.    Substance and Sexual Activity  . Alcohol use: Yes    Alcohol/week: 0.0 standard drinks    Comment: 2 servings of hard cider weekly  . Drug use: No  . Sexual activity: Not on file  Other Topics Concern  . Not on file  Social History Narrative   Engaged to be married.     Social Determinants of  Health   Financial Resource Strain:   . Difficulty of Paying Living Expenses: Not on file  Food Insecurity:   . Worried About Charity fundraiser in the Last Year: Not on file  . Ran Out of Food in the Last Year: Not on file  Transportation Needs:   . Lack of Transportation (Medical): Not on file  . Lack of Transportation (Non-Medical): Not on file  Physical Activity:   . Days of Exercise per Week: Not on file  . Minutes of Exercise per Session: Not on file  Stress:   . Feeling of Stress : Not on file  Social Connections:   . Frequency of Communication with Friends and Family: Not on file  . Frequency of Social Gatherings with Friends and Family: Not on file  . Attends Religious Services: Not on file  . Active Member of Clubs or Organizations: Not on file  . Attends Archivist Meetings: Not on file  . Marital Status: Not on file  Intimate Partner Violence:   . Fear of Current or Ex-Partner: Not on file  . Emotionally Abused: Not on file  . Physically Abused: Not on file  . Sexually Abused: Not on file    Family History  Problem Relation Age of Onset  . Liver disease Father   . Breast cancer Mother   . Diabetes Mother   . Stroke  Maternal Grandmother   . Bipolar disorder Paternal Grandfather     BP 112/80   Ht 6\' 1"  (1.854 m)   Wt 204 lb (92.5 kg)   BMI 26.91 kg/m   Review of Systems: See HPI above.     Objective:  Physical Exam:  Vitals:   12/07/19 1027  BP: 112/80  Height: 6\' 1"  (1.854 m)  Weight: 204 lb (92.5 kg)  BMI (Calculated): 26.92   General: Alert and oriented x3, NAD, comfortable in exam room HEENT: normocephalic and atraumatic, PERRLA, EOMI, conjunctivae and sclerae clear, vision grossly intact, MMM, no erythema or exudates, trachea midline, Cardio: RRR, normal S1/S2 with no M/R/G,  Resp: breathing easily on RA, lungs clear to auscultation bilaterally   Right Shoulder -inspection: more prominent skin creases on the right with mild  winging, no bruising, swelling, other deformity -Palpation: tenderness to palpation over the right trapezius superiorly -ROM (active): abduction 180; forward flexion: 180; internal rotation T10 -Strength: abduction 5/5; forward flexion 5/5; Internal Rotation 5/5; External Rotation 5/5 -Special Tests: negative Hawkins , negative speed's,  -scapular motion: mild right sided scapular winging with retraction, normal alignment, noted right sided winging when pushing against wall  Cervical exam -Inspection: no discoloration, no deformity -Palpation: no tenderness to palpation -ROM (active): full range of motion  Assessment & Plan:   Kyle Ward is a 29 y.o. male presenting for f/u of right sided trapezius/scapular pain. Although he gained moderate short-term relief (~2 weeks) following trigger point injection from prior visit, symptoms of pain with scapular retraction persist. Due to relief from prior trigger point injection, will re-perform procedure (as described below) in hopes that patient may continue with PT and strengthening muscles surrounding scapula. Advised patient to continue chiropractic and massage as long as it continues to provide relief.   After informed consent timeout was performed, patient was seated on exam table. Right trapezius area was prepped with alcohol swab. Muscle was then injected with 3:1 mL bupivicaine:depomedrol.  Tolerated the procedure well without immediate complications.  Carolyne Littles MS4

## 2019-12-07 NOTE — Patient Instructions (Addendum)
Continue with chiropractic care, physical therapy though you will be weaned down from these. As you are, make sure you're doing home exercises and stretches daily. We repeated the trigger point injection today. Follow up with me in 3 months.

## 2020-01-02 ENCOUNTER — Telehealth: Payer: Self-pay

## 2020-01-02 MED ORDER — MELOXICAM 15 MG PO TABS: 7.5000 mg | ORAL_TABLET | Freq: Every day | ORAL | 2 refills | Status: AC | PRN

## 2020-01-02 NOTE — Telephone Encounter (Signed)
Refill sent to pharmacy.   

## 2020-01-03 ENCOUNTER — Ambulatory Visit: Payer: 59 | Attending: Family Medicine | Admitting: Physical Therapy

## 2020-01-03 ENCOUNTER — Encounter: Payer: Self-pay | Admitting: Physical Therapy

## 2020-01-03 ENCOUNTER — Other Ambulatory Visit: Payer: Self-pay

## 2020-01-03 DIAGNOSIS — M25511 Pain in right shoulder: Secondary | ICD-10-CM | POA: Diagnosis present

## 2020-01-03 DIAGNOSIS — R252 Cramp and spasm: Secondary | ICD-10-CM

## 2020-01-03 DIAGNOSIS — M542 Cervicalgia: Secondary | ICD-10-CM

## 2020-01-03 NOTE — Therapy (Signed)
[place], [place], [address] Phone: [phone]   Fax:  [phone]  Physical Therapy Treatment  Patient Details  Name: Kyle Ward MRN: [id number] Date of Birth: May 09, 1991 Referring Provider (PT): Hudnall   Encounter Date: 01/03/2020  PT End of Session - 01/03/20 1052    Visit Number  7    Date for PT Re-Evaluation  02/05/20    PT Start Time  5397    PT Stop Time  1104    PT Time Calculation (min)  49 min    Activity Tolerance  Patient tolerated treatment well    Behavior During Therapy  [facility] for tasks assessed/performed       Past Medical History:  Diagnosis Date  . Anxiety     Past Surgical History:  Procedure Laterality Date  . ELBOW SURGERY      There were no vitals filed for this visit.  Subjective Assessment - 01/03/20 1025    Subjective  Patient has not been in in 5 weeks.  He went back to MD had another trigger point injection, saw the chiropractor 16 x, He reports that he is better to some extenst but symptoms still really bothering him    Currently in Pain?  Yes    Pain Score  2     Pain Location  Shoulder    Pain Orientation  Right    Pain Descriptors / Indicators  Aching    Aggravating Factors   just activity pain can be up to 8/10                       [facility] Adult PT Treatment/Exercise - 01/03/20 0001      Moist Heat Therapy   Number Minutes Moist Heat  12 Minutes    Moist Heat Location  Shoulder      Electrical Stimulation   Electrical Stimulation Location  R shoulder blade     Electrical Stimulation Action  IFC    Electrical Stimulation Parameters  supine    Electrical Stimulation Goals  Pain      Manual Therapy   Manual Therapy  Soft tissue mobilization;Joint mobilization    Joint Mobilization  PA and rotational to the thoracic spine, some rib mobilizations    Soft tissue mobilization  to the rhomoids, the upper trap and levator area       Trigger Point Dry Needling - 01/03/20 0001    Consent Given?  Yes    Muscles Treated Upper Quadrant  Rhomboids    Upper Trapezius Response  Twitch reponse elicited    Levator Scapulae Response  Twitch response elicited    Rhomboids Response  Twitch response elicited             PT Short Term Goals - 11/29/19 1553      PT SHORT TERM GOAL #1   Title  independent with initial HEP    Status  Achieved        PT Long Term Goals - 01/03/20 1146      PT LONG TERM GOAL #1   Title  understand posture and body mechanics    Status  Partially Met      PT LONG TERM GOAL #2   Title  report pain decreased 50%    Status  Partially Met      PT LONG TERM GOAL #3   Title  increase cervical sidebending to the left to [facility]  Status  On-going      PT LONG TERM GOAL #4   Title  independent with advanced HEP or gym    Status  On-going            Plan - 01/03/20 1052    Clinical Impression Statement  PAtient reports that he has stopped seeing the chiropractor, had a second injection, he is focused on the scapular popping, I feel that strengthening and posture will help this, he does have trigger points and spasms that will be addressed with mobilization of the mms and the vertebral area    PT Frequency  2x / week    PT Duration  4 weeks    PT Treatment/Interventions  ADLs/Self Care Home Management;Electrical Stimulation;Iontophoresis 59m/ml Dexamethasone;Moist Heat;Traction;Ultrasound;Cryotherapy;Manual techniques;Dry needling;Therapeutic activities;Therapeutic exercise;Neuromuscular re-education;Patient/family education    PT Next Visit Plan  cont scapular stability continue dry needling    Consulted and Agree with Plan of Care  Patient       Patient will benefit from skilled therapeutic intervention in order to improve the following deficits and impairments:  Improper body mechanics, Pain, Postural dysfunction, Increased muscle spasms, Decreased range of motion  Visit  Diagnosis: Cramp and spasm  Acute pain of right shoulder  Cervicalgia     Problem List Patient Active Problem List   Diagnosis Date Noted  . Healthcare maintenance 10/21/2019  . Chronic right shoulder pain 10/21/2019  . Palpitations 05/17/2015    ASumner Boast, PT 01/03/2020, 11:47 AM  [place] [address]Phone: [phone]  Fax:  [phone] Name: Kyle ERICSSONMRN: [id number] of Birth: [birth year]

## 2020-01-10 ENCOUNTER — Ambulatory Visit: Payer: 59 | Admitting: Physical Therapy

## 2020-01-11 ENCOUNTER — Other Ambulatory Visit: Payer: Self-pay

## 2020-01-11 ENCOUNTER — Encounter: Payer: Self-pay | Admitting: Physical Therapy

## 2020-01-11 ENCOUNTER — Ambulatory Visit: Payer: 59 | Attending: Family Medicine | Admitting: Physical Therapy

## 2020-01-11 DIAGNOSIS — M25511 Pain in right shoulder: Secondary | ICD-10-CM | POA: Diagnosis present

## 2020-01-11 DIAGNOSIS — R252 Cramp and spasm: Secondary | ICD-10-CM | POA: Diagnosis present

## 2020-01-11 DIAGNOSIS — M542 Cervicalgia: Secondary | ICD-10-CM | POA: Insufficient documentation

## 2020-01-11 NOTE — Therapy (Signed)
[place], [place], [address] Phone: [phone]   Fax:  [phone]  Physical Therapy Treatment  Patient Details  Name: Kyle Ward MRN: [id number] Date of Birth: [birth year] Referring Provider (PT): Hudnall   Encounter Date: 01/11/2020  PT End of Session - 01/11/20 1020    Visit Number  8    PT Start Time  0928    PT Stop Time  1030    PT Time Calculation (min)  62 min    Activity Tolerance  Patient tolerated treatment well    Behavior During Therapy  [facility] for tasks assessed/performed       Past Medical History:  Diagnosis Date  . Anxiety     Past Surgical History:  Procedure Laterality Date  . ELBOW SURGERY      There were no vitals filed for this visit.  Subjective Assessment - 01/11/20 0941    Subjective  Patient reports that he is doing well, still hurting with some different things at home, still c/o popping    Currently in Pain?  Yes    Pain Score  2     Pain Location  Scapula    Pain Orientation  Right                       [facility] Adult PT Treatment/Exercise - 01/11/20 0001      Lumbar Exercises: Aerobic   UBE (Upper Arm Bike)  constant work 25 watts 3 min each way       Lumbar Exercises: Machines for Strengthening   Other Lumbar Machine Exercise  Rows and Lats 25lb 2x10    Other Lumbar Machine Exercise  9090 ER with 5#, cross over pull downs and ups working on upper back and posteriro shoulder.      Lumbar Exercises: Standing   Shoulder Extension  20 reps;Both;Power Tower;Strengthening    Shoulder Extension Limitations  10#      Moist Heat Therapy   Number Minutes Moist Heat  12 Minutes    Moist Heat Location  Shoulder      Electrical Stimulation   Electrical Stimulation Location  R shoulder blade     Electrical Stimulation Action  IFC    Electrical Stimulation Parameters  supine    Electrical Stimulation Goals  Pain      Manual Therapy   Manual Therapy  Soft tissue mobilization;Joint mobilization    Joint Mobilization  PA and rotational to the thoracic spine, some rib mobilizations    Soft tissue mobilization  to the rhomoids, the upper trap and levator area       Trigger Point Dry Needling - 01/11/20 0001    Consent Given?  Yes    Upper Trapezius Response  Twitch reponse elicited    Levator Scapulae Response  Twitch response elicited    Rhomboids Response  Twitch response elicited             PT Short Term Goals - 11/29/19 1553      PT SHORT TERM GOAL #1   Title  independent with initial HEP    Status  Achieved        PT Long Term Goals - 01/11/20 1023      PT LONG TERM GOAL #1   Title  understand posture and body mechanics    Status  Achieved            Plan - 01/11/20 1021  Clinical Impression Statement  Patient is doing better with his exercises, able to watch and correct his posture.  With the exercises there is some popping but with PT overpressure this can stop, again cues for activation of scapular retraction and depression helps this.    PT Next Visit Plan  cont scapular stability continue dry needling    Consulted and Agree with Plan of Care  Patient       Patient will benefit from skilled therapeutic intervention in order to improve the following deficits and impairments:  Improper body mechanics, Pain, Postural dysfunction, Increased muscle spasms, Decreased range of motion  Visit Diagnosis: Cramp and spasm  Acute pain of right shoulder  Cervicalgia     Problem List Patient Active Problem List   Diagnosis Date Noted  . Healthcare maintenance 10/21/2019  . Chronic right shoulder pain 10/21/2019  . Palpitations 05/17/2015    Sumner Boast., PT 01/11/2020, 10:23 AM  [place], [place], [address] Phone: [phone]   Fax:  [phone]  Name: Kyle Ward MRN: [id number] Date  of Birth: 1990-10-11

## 2020-01-24 ENCOUNTER — Ambulatory Visit: Payer: 59 | Admitting: Physical Therapy

## 2020-01-24 ENCOUNTER — Encounter: Payer: Self-pay | Admitting: Physical Therapy

## 2020-01-24 ENCOUNTER — Other Ambulatory Visit: Payer: Self-pay

## 2020-01-24 DIAGNOSIS — R252 Cramp and spasm: Secondary | ICD-10-CM

## 2020-01-24 DIAGNOSIS — M25511 Pain in right shoulder: Secondary | ICD-10-CM

## 2020-01-24 DIAGNOSIS — M542 Cervicalgia: Secondary | ICD-10-CM

## 2020-01-24 NOTE — Therapy (Signed)
[place], [place], [address] Phone: [phone]   Fax:  [phone]  Physical Therapy Treatment  Patient Details  Name: Kyle Ward MRN: [id number] Date of Birth: 1991-06-05 Referring Provider (PT): Hudnall   Encounter Date: 01/24/2020  PT End of Session - 01/24/20 1152    Visit Number  9    Date for PT Re-Evaluation  02/05/20    PT Start Time  1058    PT Stop Time  1148    PT Time Calculation (min)  50 min    Activity Tolerance  Patient tolerated treatment well    Behavior During Therapy  [facility] for tasks assessed/performed       Past Medical History:  Diagnosis Date  . Anxiety     Past Surgical History:  Procedure Laterality Date  . ELBOW SURGERY      There were no vitals filed for this visit.  Subjective Assessment - 01/24/20 1107    Subjective  Patient reports tha the started seeing a new chiropractor, he reports that he is having less pain but still his popping symptoms    Currently in Pain?  Yes    Pain Score  2     Pain Location  Scapula    Pain Orientation  Right    Pain Descriptors / Indicators  Sore    Aggravating Factors   c/o popping with activity                       [facility] Adult PT Treatment/Exercise - 01/24/20 0001      Lumbar Exercises: Aerobic   Elliptical  I=10, R=6 x 5 minutes      Lumbar Exercises: Machines for Strengthening   Other Lumbar Machine Exercise  Rows and Lats 25lb 2x10    Other Lumbar Machine Exercise  9090 ER with 5#, cross over pull downs and ups working on upper back and posteriro shoulder.      Lumbar Exercises: Standing   Other Standing Lumbar Exercises  some throwing with two different size weighted balls      Manual Therapy   Manual Therapy  Soft tissue mobilization;Joint mobilization    Joint Mobilization  PA and rotational to the thoracic spine, some rib mobilizations, some scapular mobilizations    Soft tissue  mobilization  to the rhomoids, the upper trap and levator area       Trigger Point Dry Needling - 01/24/20 0001    Consent Given?  Yes    Upper Trapezius Response  Twitch reponse elicited    Levator Scapulae Response  Twitch response elicited    Rhomboids Response  Palpable increased muscle length             PT Short Term Goals - 11/29/19 1553      PT SHORT TERM GOAL #1   Title  independent with initial HEP    Status  Achieved        PT Long Term Goals - 01/24/20 1212      PT LONG TERM GOAL #1   Title  understand posture and body mechanics    Status  Achieved      PT LONG TERM GOAL #2   Title  report pain decreased 50%    Status  Partially Met      PT LONG TERM GOAL #3   Title  increase cervical sidebending to the left to WNL's    Status  Partially Met      PT LONG TERM GOAL #4   Title  independent with advanced HEP or gym    Status  On-going            Plan - 01/24/20 1155    Clinical Impression Statement  Patient reports that he is seeing a different chiropractor.  Reports that he is feeling stronger and having less overall symptoms.  He still is focused on the popping below the scapula, Tried some throwing and he did well with this    PT Next Visit Plan  cont scapular stability continue dry needling    Consulted and Agree with Plan of Care  Patient       Patient will benefit from skilled therapeutic intervention in order to improve the following deficits and impairments:  Improper body mechanics, Pain, Postural dysfunction, Increased muscle spasms, Decreased range of motion  Visit Diagnosis: Cramp and spasm  Acute pain of right shoulder  Cervicalgia     Problem List Patient Active Problem List   Diagnosis Date Noted  . Healthcare maintenance 10/21/2019  . Chronic right shoulder pain 10/21/2019  . Palpitations 05/17/2015    [place] Boast., PT 01/24/2020, 12:13 PM  [place], [place], [address] Phone: [phone]   Fax:  [phone]  Name: Kyle Ward MRN: [id number] Date of Birth: [birth year]

## 2020-01-31 ENCOUNTER — Ambulatory Visit: Payer: 59 | Admitting: Physical Therapy

## 2020-02-14 ENCOUNTER — Ambulatory Visit: Payer: 59 | Admitting: Physical Therapy

## 2020-05-23 ENCOUNTER — Telehealth: Payer: Self-pay | Admitting: Family Medicine

## 2020-05-23 NOTE — Telephone Encounter (Signed)
Patient called to schedule an appt with Dr. Ethelene Hal. Stated he has sinus congestion, ears blocked and sore throat. He stated he was seen 8/20 at an urgent care facility, tested negative for covid and started on a z-pak. I offered the patient a Mychart virtual appt but he wanted to see if Dr. Ethelene Hal would see him in person. Dr. Ethelene Hal stated he should followup with the urgent care facility who already treated him, or he would see him via Mychart video appt. I called the patient back and he refused the offer of a video visit. Patient hung up and so I am unsure if he is going back to the urgent care.

## 2020-05-24 ENCOUNTER — Telehealth (INDEPENDENT_AMBULATORY_CARE_PROVIDER_SITE_OTHER): Payer: 59 | Admitting: Family Medicine

## 2020-05-24 ENCOUNTER — Encounter: Payer: Self-pay | Admitting: Family Medicine

## 2020-05-24 VITALS — Ht 73.0 in | Wt 203.0 lb

## 2020-05-24 DIAGNOSIS — H6983 Other specified disorders of Eustachian tube, bilateral: Secondary | ICD-10-CM | POA: Diagnosis not present

## 2020-05-24 DIAGNOSIS — J019 Acute sinusitis, unspecified: Secondary | ICD-10-CM

## 2020-05-24 MED ORDER — PREDNISONE 20 MG PO TABS: 20.0000 mg | ORAL_TABLET | Freq: Two times a day (BID) | ORAL | 0 refills | Status: AC

## 2020-05-24 MED ORDER — AMOXICILLIN-POT CLAVULANATE 875-125 MG PO TABS: 1.0000 | ORAL_TABLET | Freq: Two times a day (BID) | ORAL | 0 refills | Status: AC

## 2020-05-24 NOTE — Progress Notes (Signed)
Established Patient Office Visit  Subjective:  Patient ID: Kyle Ward, male    DOB: 11/01/1990  Age: 29 y.o. MRN: [id number]  CC:  Chief Complaint  Patient presents with  . Sinusitis    sore throat 10 days ago stuffy nose, heavy mucus, stuffy ears symptoms x 10 days. Patient states that he tested negative for COVID last week    HPI BAZIL DHANANI presents for evaluation and treatment of a 10-day history of URI signs and symptoms to include nasal congestion, ear congestion and persistent clear to yellow-green purulent discharge.  Patient denies fevers chills, facial pressure, teeth pain, cough, shortness of breath or wheezing.  He never had diarrhea body aches.  He had tested negative for Covid.  Was treated 7 days ago with a Z-Pak.  Did not seem to help much.  Ears remain congested.  History of otitis media as a child.  No asthma history.  He had tested negative for Covid.  Denies alteration in his taste or smell.  Past Medical History:  Diagnosis Date  . Anxiety     Past Surgical History:  Procedure Laterality Date  . ELBOW SURGERY      Family History  Problem Relation Age of Onset  . Liver disease Father   . Breast cancer Mother   . Diabetes Mother   . Stroke Maternal Grandmother   . Bipolar disorder Paternal Grandfather     Social History   Socioeconomic History  . Marital status: Single    Spouse name: Not on file  . Number of children: Not on file  . Years of education: Not on file  . Highest education level: Not on file  Occupational History  . Not on file  Tobacco Use  . Smoking status: Former Research scientist (life sciences)  . Smokeless tobacco: Never Used  . Tobacco comment: Smoked during college.    Substance and Sexual Activity  . Alcohol use: Yes    Alcohol/week: 0.0 standard drinks    Comment: 2 servings of hard cider weekly  . Drug use: No  . Sexual activity: Not on file  Other Topics Concern  . Not on file  Social History Narrative   Engaged to be married.      Social Determinants of Health   Financial Resource Strain:   . Difficulty of Paying Living Expenses: Not on file  Food Insecurity:   . Worried About Charity fundraiser in the Last Year: Not on file  . Ran Out of Food in the Last Year: Not on file  Transportation Needs:   . Lack of Transportation (Medical): Not on file  . Lack of Transportation (Non-Medical): Not on file  Physical Activity:   . Days of Exercise per Week: Not on file  . Minutes of Exercise per Session: Not on file  Stress:   . Feeling of Stress : Not on file  Social Connections:   . Frequency of Communication with Friends and Family: Not on file  . Frequency of Social Gatherings with Friends and Family: Not on file  . Attends Religious Services: Not on file  . Active Member of Clubs or Organizations: Not on file  . Attends Archivist Meetings: Not on file  . Marital Status: Not on file  Intimate Partner Violence:   . Fear of Current or Ex-Partner: Not on file  . Emotionally Abused: Not on file  . Physically Abused: Not on file  . Sexually Abused: Not on file    Outpatient Medications  Prior to Visit  Medication Sig Dispense Refill  . meloxicam (MOBIC) 15 MG tablet Take 0.5-1 tablets (7.5-15 mg total) by mouth daily as needed. (Patient not taking: Reported on 05/24/2020) 30 tablet 2   No facility-administered medications prior to visit.    No Known Allergies  ROS Review of Systems  Constitutional: Negative.   HENT: Positive for congestion, postnasal drip and rhinorrhea. Negative for dental problem, ear discharge, ear pain and sinus pain.   Eyes: Negative for photophobia and visual disturbance.  Respiratory: Negative.  Negative for cough, chest tightness and shortness of breath.   Cardiovascular: Negative.   Gastrointestinal: Negative.  Negative for diarrhea, nausea and vomiting.  Genitourinary: Negative.   Musculoskeletal: Negative for arthralgias and myalgias.  Allergic/Immunologic: Negative  for immunocompromised state.  Neurological: Negative for light-headedness and headaches.  Hematological: Does not bruise/bleed easily.  Psychiatric/Behavioral: Negative.       Objective:    Physical Exam Vitals and nursing note reviewed.  Constitutional:      General: He is not in acute distress. Pulmonary:     Effort: Pulmonary effort is normal.  Neurological:     Mental Status: He is oriented to person, place, and time.  Psychiatric:        Mood and Affect: Mood normal.        Behavior: Behavior normal.     Ht 6\' 1"  (1.854 m)   Wt 203 lb (92.1 kg)   BMI 26.78 kg/m  Wt Readings from Last 3 Encounters:  05/24/20 203 lb (92.1 kg)  12/07/19 204 lb (92.5 kg)  10/26/19 205 lb (93 kg)     Health Maintenance Due  Topic Date Due  . Hepatitis C Screening  Never done  . COVID-19 Vaccine (1) Never done  . HIV Screening  Never done  . TETANUS/TDAP  Never done  . INFLUENZA VACCINE  05/06/2020    There are no preventive care reminders to display for this patient.  Lab Results  Component Value Date   TSH 1.048 05/17/2015   Lab Results  Component Value Date   WBC 7.2 05/15/2015   HGB 15.2 05/15/2015   HCT 43.7 05/15/2015   MCV 87.2 05/15/2015   PLT 226 05/15/2015   Lab Results  Component Value Date   NA 136 05/15/2015   K 3.7 05/15/2015   CO2 27 05/15/2015   GLUCOSE 95 05/15/2015   BUN 12 05/15/2015   CREATININE 0.84 05/15/2015   CALCIUM 9.2 05/15/2015   ANIONGAP 5 05/15/2015   No results found for: CHOL No results found for: HDL No results found for: LDLCALC No results found for: TRIG No results found for: CHOLHDL No results found for: HGBA1C    Assessment & Plan:   Problem List Items Addressed This Visit    None    Visit Diagnoses    Acute non-recurrent sinusitis, unspecified location    -  Primary   Relevant Medications   predniSONE (DELTASONE) 20 MG tablet   amoxicillin-clavulanate (AUGMENTIN) 875-125 MG tablet   ETD (Eustachian tube  dysfunction), bilateral       Relevant Medications   predniSONE (DELTASONE) 20 MG tablet      Meds ordered this encounter  Medications  . predniSONE (DELTASONE) 20 MG tablet    Sig: Take 1 tablet (20 mg total) by mouth 2 (two) times daily with a meal for 10 days.    Dispense:  20 tablet    Refill:  0  . amoxicillin-clavulanate (AUGMENTIN) 875-125 MG tablet  Sig: Take 1 tablet by mouth 2 (two) times daily.    Dispense:  20 tablet    Refill:  0    Follow-up: Return if symptoms worsen or fail to improve.    Libby Maw, MD   Virtual Visit via Telephone Note  I connected with Kartel Wolbert Moret on 05/24/20 at 11:00 AM EDT by telephone and verified that I am speaking with the correct person using two identifiers.  Location: Patient: home alone  Provider:   I discussed the limitations, risks, security and privacy concerns of performing an evaluation and management service by telephone and the availability of in person appointments. I also discussed with the patient that there may be a patient responsible charge related to this service. The patient expressed understanding and agreed to proceed.   History of Present Illness:    Observations/Objective:   Assessment and Plan:   Follow Up Instructions:    I discussed the assessment and treatment plan with the patient. The patient was provided an opportunity to ask questions and all were answered. The patient agreed with the plan and demonstrated an understanding of the instructions.   The patient was advised to call back or seek an in-person evaluation if the symptoms worsen or if the condition fails to improve as anticipated.  I provided 20  minutes of non-face-to-face time during this encounter.   Libby Maw, MD

## 2020-08-24 ENCOUNTER — Other Ambulatory Visit: Payer: 59

## 2020-08-24 DIAGNOSIS — Z20822 Contact with and (suspected) exposure to covid-19: Secondary | ICD-10-CM

## 2020-08-26 LAB — SARS-COV-2, NAA 2 DAY TAT

## 2020-08-26 LAB — NOVEL CORONAVIRUS, NAA: SARS-CoV-2, NAA: NOT DETECTED

## 2022-05-02 ENCOUNTER — Ambulatory Visit (INDEPENDENT_AMBULATORY_CARE_PROVIDER_SITE_OTHER): Payer: 59 | Admitting: Psychiatry

## 2022-05-02 ENCOUNTER — Encounter (HOSPITAL_COMMUNITY): Payer: Self-pay | Admitting: Psychiatry

## 2022-05-02 DIAGNOSIS — G4700 Insomnia, unspecified: Secondary | ICD-10-CM | POA: Insufficient documentation

## 2022-05-02 DIAGNOSIS — F419 Anxiety disorder, unspecified: Secondary | ICD-10-CM | POA: Insufficient documentation

## 2022-05-02 DIAGNOSIS — F4010 Social phobia, unspecified: Secondary | ICD-10-CM | POA: Diagnosis not present

## 2022-05-02 DIAGNOSIS — F410 Panic disorder [episodic paroxysmal anxiety] without agoraphobia: Secondary | ICD-10-CM | POA: Diagnosis not present

## 2022-05-02 DIAGNOSIS — F900 Attention-deficit hyperactivity disorder, predominantly inattentive type: Secondary | ICD-10-CM

## 2022-05-02 MED ORDER — DIAZEPAM 10 MG PO TABS: 10.0000 mg | ORAL_TABLET | Freq: Every day | ORAL | 0 refills | Status: DC | PRN

## 2022-05-02 MED ORDER — AMPHETAMINE-DEXTROAMPHETAMINE 10 MG PO TABS: 10.0000 mg | ORAL_TABLET | Freq: Every day | ORAL | 0 refills | Status: DC

## 2022-05-02 MED ORDER — PAROXETINE HCL 10 MG PO TABS: 10.0000 mg | ORAL_TABLET | Freq: Every day | ORAL | 0 refills | Status: DC

## 2022-05-02 NOTE — Progress Notes (Signed)
Psychiatric Initial Adult Assessment   Patient Identification: Kyle Ward MRN:  [id number] Date of Evaluation:  05/02/2022 Referral Source: Dr. Toy Cookey psychiatrist Chief Complaint:   Chief Complaint  Patient presents with   Panic Attack   Establish Care   Visit Diagnosis:    ICD-10-CM   1. Panic disorder  F41.0     2. Attention deficit hyperactivity disorder (ADHD), predominantly inattentive type  F90.0     3. Social anxiety disorder  F40.10     Virtual Visit via Video Note  I connected with Kyle Ward on 05/02/22 at  9:00 AM EDT by a video enabled telemedicine application and verified that I am speaking with the correct person using two identifiers.  Location: Patient: parked car Provider: home office   I discussed the limitations of evaluation and management by telemedicine and the availability of in person appointments. The patient expressed understanding and agreed to proceed.      I discussed the assessment and treatment plan with the patient. The patient was provided an opportunity to ask questions and all were answered. The patient agreed with the plan and demonstrated an understanding of the instructions.   The patient was advised to call back or seek an in-person evaluation if the symptoms worsen or if the condition fails to improve as anticipated.  I provided 60 minutes of non-face-to-face time during this encounter, including chart review and documentation   History of Present Illness: Patient is a 31 years old currently married Caucasian male referred by psychiatrist Dr. 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  Married 1 year ago   Patient presents with history of more than 6 years of anxiety states that he was having panic attacks 6 years ago at night he felt chest tightness and extreme anxiety had to visit the emergency room later on treated by psychiatrist and started on  diazepam he has used Xanax for as needed anxiety in the past and Ativan.  He felt diazepam helped his panic condition and has been taking regularly but recently has changed to as needed. Patient also has been diagnosed with ADD states that he is not sure if he was on medication high school but in college time he started having inattention and cannot focus was started on Vyvanse that helped him graduate over was able to function well.  After which she has been off medication but recently 1 year ago he was having difficulty in focusing performing work forgetfulness and Dr. Toy Cookey started him on Adderall he has been on Adderall 10 mg 2 times a day.  He uses alcohol 1 or 2 times a week he understands alcohol can affect medication and inattention.  He uses marijuana in the remote past denies using anything recent  Patient denies symptoms of bipolar or manic episode or episodes of depression or depression on a day-to-day basis  Sleep is reasonable energy can be low.  He still gets anxious around social situations if that he would brush or have performance anxiety he has used SSRIs before including Lexapro he has used propranolol in the past but it made him feel cold it did not help for his performance anxiety He still suffers from social anxiety or feeling panicky and situation if he has to perform  Denies excessive use of caffeine  No prior psychiatric admission no prior psychotic episode Aggravating factors; performance anxiety social situations Modifying factors; his dog, his family,  marriage, his job Duration more than 6 years   Past Psychiatric History: anxiety, adhd  Previous Psychotropic Medications: Yes   Substance Abuse History in the last 12 months:  Yes.    Consequences of Substance Abuse: Discussed effect of alcohol use to depression, anxiety and medications, judjement  Past Medical History:  Past Medical History:  Diagnosis Date   Anxiety     Past Surgical History:  Procedure  Laterality Date   ELBOW SURGERY      Family Psychiatric History: Grand PA: Bipolar  Family History:  Family History  Problem Relation Age of Onset   Liver disease Father    Breast cancer Mother    Diabetes Mother    Stroke Maternal Grandmother    Bipolar disorder Paternal Grandfather     Social History:   Social History   Socioeconomic History   Marital status: Single    Spouse name: Not on file   Number of children: Not on file   Years of education: Not on file   Highest education level: Not on file  Occupational History   Not on file  Tobacco Use   Smoking status: Former   Smokeless tobacco: Never   Tobacco comments:    Smoked during college.    Substance and Sexual Activity   Alcohol use: Yes    Alcohol/week: 0.0 standard drinks of alcohol    Comment: 2 servings of hard cider weekly   Drug use: No   Sexual activity: Not on file  Other Topics Concern   Not on file  Social History Narrative   Engaged to be married.     Social Determinants of Health   Financial Resource Strain: Not on file  Food Insecurity: Not on file  Transportation Needs: Not on file  Physical Activity: Not on file  Stress: Not on file  Social Connections: Not on file    Additional Social History: grew up with parents, good no abuse, graduated college  Allergies:  No Known Allergies  Metabolic Disorder Labs: No results found for: "HGBA1C", "MPG" No results found for: "PROLACTIN" No results found for: "CHOL", "TRIG", "HDL", "CHOLHDL", "VLDL", "LDLCALC" Lab Results  Component Value Date   TSH 1.048 05/17/2015    Therapeutic Level Labs: No results found for: "LITHIUM" No results found for: "CBMZ" No results found for: "VALPROATE"  Current Medications: Current Outpatient Medications  Medication Sig Dispense Refill   amitriptyline (ELAVIL) 10 MG tablet Take 10 mg by mouth at bedtime.     amoxicillin-clavulanate (AUGMENTIN) 875-125 MG tablet Take 1 tablet by mouth 2 (two) times  daily. 20 tablet 0   amphetamine-dextroamphetamine (ADDERALL) 10 MG tablet Take 1 tablet (10 mg total) by mouth daily. 30 tablet 0   diazepam (VALIUM) 10 MG tablet Take 1 tablet (10 mg total) by mouth daily as needed for anxiety. 15 tablet 0   meloxicam (MOBIC) 15 MG tablet Take 0.5-1 tablets (7.5-15 mg total) by mouth daily as needed. (Patient not taking: Reported on 05/24/2020) 30 tablet 2   PARoxetine (PAXIL) 10 MG tablet Take 1 tablet (10 mg total) by mouth daily. Start half a day for 4 days then start one a day 30 tablet 0   No current facility-administered medications for this visit.     Psychiatric Specialty Exam: Review of Systems  Cardiovascular:  Negative for chest pain.  Neurological:  Negative for tremors.  Psychiatric/Behavioral:  Negative for agitation.     There were no vitals taken for this visit.There is no height or  weight on file to calculate BMI.  General Appearance: Fairly Groomed  Eye Contact:  Good  Speech:  Slow  Volume:  Decreased  Mood:  Euthymic  Affect:  Congruent  Thought Process:  Goal Directed  Orientation:  Full (Time, Place, and Person)  Thought Content:  Rumination  Suicidal Thoughts:  No  Homicidal Thoughts:  No  Memory:  Immediate;   Fair  Judgement:  Fair  Insight:  Fair  Psychomotor Activity:  Normal  Concentration:  Concentration: Fair  Recall:  [employer] of Knowledge:Fair  Language: Good  Akathisia:  No  Handed:    AIMS (if indicated):  not done  Assets:  Desire for Improvement Financial Resources/Insurance Housing  ADL's:  Intact  Cognition: WNL  Sleep:  Fair   Screenings: [place] Office Visit from 05/02/2022 in [place] Office Visit from 10/21/2019 in [facility]  PHQ-2 Total Score 0 0      [place] Visit from 05/02/2022 in [facility] No Risk       Assessment and  Plan: as follows Panic disorder; he is currently on Valium as needed we discussed breathing techniques and recommend therapy said he can hold off from therapy till next visit we talked about SSRIs he has not used Paxil we will start 5 mg a small dose while we may consider to taper down his Valium.  He is taking Valium as needed discussed breathing techniques  Social anxiety disorder; discussed distraction from negative thoughts and adding social situations in which she feels comfortable in the beginning consider therapy start Paxil as above we will start lowering down Valium as of now he is taking as needed  ADHD; he does not have any documentation of ADHD but the referral included that he has been diagnosed with ADHD I discussed in detail that stimulant medication can make his anxiety worse. He is willing to cut down the Adderall to 10 mg instead of 20 mg a day also discussed drug holidays Also discussed his anxiety medication including Valium can cause forgetfulness and may worsen inattentive symptoms  He is willing to balance off medication as we progress  Patient denies any active psychotic symptoms no suicidal homicidal ideations he agrees with the plan and medications and condition was discussed  Follow-up in 1 month or earlier if needed prescription sent with Paxil, 10 mg Adderall, 10 mg Valium  Collaboration of Care: Other read Dr. Toy Cookey referral and reviewed notes  Patient/Guardian was advised Release of Information must be obtained prior to any record release in order to collaborate their care with an outside provider. Patient/Guardian was advised if they have not already done so to contact the registration department to sign all necessary forms in order for [place] to release information regarding their care.   Consent: Patient/Guardian gives verbal consent for treatment and assignment of benefits for services provided during this visit. Patient/Guardian expressed understanding and agreed  to proceed.   Merian Capron, MD 7/28/20239:40 AM

## 2022-05-26 ENCOUNTER — Telehealth (HOSPITAL_COMMUNITY): Payer: Self-pay

## 2022-05-26 MED ORDER — AMPHETAMINE-DEXTROAMPHETAMINE 10 MG PO TABS: 10.0000 mg | ORAL_TABLET | Freq: Every day | ORAL | 0 refills | Status: DC

## 2022-05-26 NOTE — Telephone Encounter (Signed)
Patient called to get a refill on Adderall sent to [facility]. Last refill 07/28 Next appt 08/30

## 2022-05-27 ENCOUNTER — Telehealth (HOSPITAL_COMMUNITY): Payer: Self-pay

## 2022-05-27 MED ORDER — AMPHETAMINE-DEXTROAMPHETAMINE 10 MG PO TABS: 10.0000 mg | ORAL_TABLET | Freq: Every day | ORAL | 0 refills | Status: DC

## 2022-05-27 NOTE — Telephone Encounter (Signed)
Patient left a vm stating that he misplaced the rest of his Adderall and wants to know if you can let the pharmacy know to fill it early? He says the pharmacy needs you to put it in the rx that it can be filled early. He also says that the Fluoxetine is working really well.   CB# [phone]

## 2022-05-27 NOTE — Telephone Encounter (Signed)
Last filled on [place]. You sent a refill yesterday but he can not fill it unless the rx says it can be filled early

## 2022-06-04 ENCOUNTER — Telehealth (INDEPENDENT_AMBULATORY_CARE_PROVIDER_SITE_OTHER): Payer: 59 | Admitting: Psychiatry

## 2022-06-04 ENCOUNTER — Encounter (HOSPITAL_COMMUNITY): Payer: Self-pay | Admitting: Psychiatry

## 2022-06-04 DIAGNOSIS — F418 Other specified anxiety disorders: Secondary | ICD-10-CM | POA: Diagnosis not present

## 2022-06-04 DIAGNOSIS — F410 Panic disorder [episodic paroxysmal anxiety] without agoraphobia: Secondary | ICD-10-CM

## 2022-06-04 DIAGNOSIS — F4010 Social phobia, unspecified: Secondary | ICD-10-CM | POA: Diagnosis not present

## 2022-06-04 DIAGNOSIS — F900 Attention-deficit hyperactivity disorder, predominantly inattentive type: Secondary | ICD-10-CM

## 2022-06-04 MED ORDER — PROPRANOLOL HCL 10 MG PO TABS: 10.0000 mg | ORAL_TABLET | Freq: Every day | ORAL | 0 refills | Status: DC | PRN

## 2022-06-04 MED ORDER — PAROXETINE HCL 10 MG PO TABS: 15.0000 mg | ORAL_TABLET | Freq: Every day | ORAL | 1 refills | Status: DC

## 2022-06-04 NOTE — Progress Notes (Signed)
Psychiatric Initial Adult Assessment   Patient Identification: Kyle Ward MRN:  [id number] Date of Evaluation:  06/04/2022 Referral Source: Dr. Toy Cookey psychiatrist Chief Complaint:  anxiety, performance anxiety No chief complaint on file.  Visit Diagnosis:    ICD-10-CM   1. Panic disorder  F41.0     2. Attention deficit hyperactivity disorder (ADHD), predominantly inattentive type  F90.0     3. Social anxiety disorder  F40.10     4. Situational anxiety  F41.8     Virtual Visit via Video Note  I connected with Kyle Ward on 06/04/22 at  8:30 AM EDT by a video enabled telemedicine application and verified that I am speaking with the correct person using two identifiers.  Location: Patient: home Provider: home office   I discussed the limitations of evaluation and management by telemedicine and the availability of in person appointments. The patient expressed understanding and agreed to proceed.      I discussed the assessment and treatment plan with the patient. The patient was provided an opportunity to ask questions and all were answered. The patient agreed with the plan and demonstrated an understanding of the instructions.   The patient was advised to call back or seek an in-person evaluation if the symptoms worsen or if the condition fails to improve as anticipated.  I provided 25 minutes of non-face-to-face time during this encounter, including chart review and documentation   History of Present Illness: Patient is a 31 years old currently married Caucasian male initially referred by psychiatrist Dr. 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  Last viist started paxil has helped panic attacks and social anxiety Still worries related to performance  anxiety Has taken valium prn  Has tolerated lowering adderall to '10mg'$  once a day for adhd prior he was on '20mg'$    Denies excessive use of caffeine  Aggravating factors; performance anxiety Modifying factors; his dog, his family, marriage, job  Duration more than 6 years Severity improving except for situational anxiety   Past Psychiatric History: anxiety, adhd  Previous Psychotropic Medications: Yes   Substance Abuse History in the last 12 months:  Yes.    Consequences of Substance Abuse: Discussed effect of alcohol use to depression, anxiety and medications, judjement  Past Medical History:  Past Medical History:  Diagnosis Date   Anxiety     Past Surgical History:  Procedure Laterality Date   ELBOW SURGERY      Family Psychiatric History: Grand PA: Bipolar  Family History:  Family History  Problem Relation Age of Onset   Liver disease Father    Breast cancer Mother    Diabetes Mother    Stroke Maternal Grandmother    Bipolar disorder Paternal Grandfather     Social History:   Social History   Socioeconomic History   Marital status: Single    Spouse name: Not on file   Number of children: Not on file   Years of education: Not on file   Highest education level: Not on file  Occupational History   Not on file  Tobacco Use   Smoking status: Former   Smokeless tobacco: Never   Tobacco comments:    Smoked during college.    Substance and Sexual Activity  Alcohol use: Yes    Alcohol/week: 0.0 standard drinks of alcohol    Comment: 2 servings of hard cider weekly   Drug use: No   Sexual activity: Not on file  Other Topics Concern   Not on file  Social History Narrative   Engaged to be married.     Social Determinants of Health   Financial Resource Strain: Not on file  Food Insecurity: Not on file  Transportation Needs: Not on file  Physical Activity: Not on file  Stress: Not on file  Social Connections: Not on file    Additional Social  History: grew up with parents, good no abuse, graduated college  Allergies:  No Known Allergies  Metabolic Disorder Labs: No results found for: "HGBA1C", "MPG" No results found for: "PROLACTIN" No results found for: "CHOL", "TRIG", "HDL", "CHOLHDL", "VLDL", "LDLCALC" Lab Results  Component Value Date   TSH 1.048 05/17/2015    Therapeutic Level Labs: No results found for: "LITHIUM" No results found for: "CBMZ" No results found for: "VALPROATE"  Current Medications: Current Outpatient Medications  Medication Sig Dispense Refill   propranolol (INDERAL) 10 MG tablet Take 1 tablet (10 mg total) by mouth daily as needed. 10 tablet 0   amoxicillin-clavulanate (AUGMENTIN) 875-125 MG tablet Take 1 tablet by mouth 2 (two) times daily. 20 tablet 0   amphetamine-dextroamphetamine (ADDERALL) 10 MG tablet Take 1 tablet (10 mg total) by mouth daily. 30 tablet 0   diazepam (VALIUM) 10 MG tablet Take 1 tablet (10 mg total) by mouth daily as needed for anxiety. 15 tablet 0   meloxicam (MOBIC) 15 MG tablet Take 0.5-1 tablets (7.5-15 mg total) by mouth daily as needed. (Patient not taking: Reported on 05/24/2020) 30 tablet 2   PARoxetine (PAXIL) 10 MG tablet Take 1.5 tablets (15 mg total) by mouth daily. Take one and half a day 45 tablet 1   No current facility-administered medications for this visit.     Psychiatric Specialty Exam: Review of Systems  Cardiovascular:  Negative for chest pain.  Neurological:  Negative for tremors.  Psychiatric/Behavioral:  Negative for agitation.     There were no vitals taken for this visit.There is no height or weight on file to calculate BMI.  General Appearance: Fairly Groomed  Eye Contact:  Good  Speech:  Slow  Volume:  Decreased  Mood:  Euthymic  Affect:  Congruent  Thought Process:  Goal Directed  Orientation:  Full (Time, Place, and Person)  Thought Content:  Rumination  Suicidal Thoughts:  No  Homicidal Thoughts:  No  Memory:  Immediate;   Fair   Judgement:  Fair  Insight:  Fair  Psychomotor Activity:  Normal  Concentration:  Concentration: Fair  Recall:  [employer] of [place]:Fair  Language: Good  Akathisia:  No  Handed:    AIMS (if indicated):  not done  Assets:  Desire for Improvement Financial Resources/Insurance Housing  ADL's:  Intact  Cognition: WNL  Sleep:  Fair   Screenings: [place] Office Visit from 05/02/2022 in [place] Office Visit from 10/21/2019 in [facility]  PHQ-2 Total Score 0 0      Flowsheet Row Video Visit from 06/04/2022 in [place] Office Visit from 05/02/2022 in [place] No Risk No Risk       Assessment and Plan: as follows  Prior documentation reviewed  Panic disorder; improved  but at times gets anxious, increase paxil to '15mg'$    Social anxiety disorder; improving increase paxil as above  ADHD; manageable with adderall '10mg'$  now, will continue  Performance anxiety: paxil would help but will add low dose inderal prn for situational anxiety , call back for concerns   Collaboration of Care: Other read Dr. Toy Cookey referral and reviewed notes  Patient/Guardian was advised Release of Information must be obtained prior to any record release in order to collaborate their care with an outside provider. Patient/Guardian was advised if they have not already done so to contact the registration department to sign all necessary forms in order for [place] to release information regarding their care.   Consent: Patient/Guardian gives verbal consent for treatment and assignment of benefits for services provided during this visit. Patient/Guardian expressed understanding and agreed to proceed.   Merian Capron, MD 8/30/20238:55 AM

## 2022-06-11 ENCOUNTER — Telehealth (HOSPITAL_COMMUNITY): Payer: Self-pay | Admitting: *Deleted

## 2022-06-11 NOTE — Telephone Encounter (Signed)
Patient called wanted to inform Provider about his medications that were recently adjusted ---  PARoxetine (PAXIL) 10 MG tablet  "Doing Well It's Been Helpful"    && Asked if possibly you would reconsider changing the Adderall dosage  "says it's hurting him @ work with the decrease" amphetamine-dextroamphetamine (ADDERALL) 10 MG tablet --- asked if he could possibly do  10 mg in the AM  & 10 mg in the PM??  NEXT APPT 07/30/22

## 2022-06-12 NOTE — Telephone Encounter (Signed)
Patient informed. He stated his understanding

## 2022-06-23 ENCOUNTER — Other Ambulatory Visit (HOSPITAL_COMMUNITY): Payer: Self-pay | Admitting: Psychiatry

## 2022-06-23 ENCOUNTER — Telehealth (HOSPITAL_COMMUNITY): Payer: Self-pay

## 2022-06-23 MED ORDER — AMPHETAMINE-DEXTROAMPHETAMINE 10 MG PO TABS: 10.0000 mg | ORAL_TABLET | Freq: Every day | ORAL | 0 refills | Status: DC

## 2022-06-23 NOTE — Telephone Encounter (Signed)
Patient called to get a refill on Adderall '10mg'$  sent to [facility] refill 08/22 Next ov 10/25

## 2022-06-23 NOTE — Telephone Encounter (Signed)
sent 

## 2022-06-30 ENCOUNTER — Telehealth (HOSPITAL_COMMUNITY): Payer: Self-pay

## 2022-06-30 NOTE — Telephone Encounter (Signed)
Nothing needed at this time.  

## 2022-07-14 ENCOUNTER — Other Ambulatory Visit (HOSPITAL_COMMUNITY): Payer: Self-pay | Admitting: Psychiatry

## 2022-07-30 ENCOUNTER — Encounter (HOSPITAL_COMMUNITY): Payer: Self-pay | Admitting: Psychiatry

## 2022-07-30 ENCOUNTER — Telehealth (INDEPENDENT_AMBULATORY_CARE_PROVIDER_SITE_OTHER): Payer: 59 | Admitting: Psychiatry

## 2022-07-30 DIAGNOSIS — F410 Panic disorder [episodic paroxysmal anxiety] without agoraphobia: Secondary | ICD-10-CM

## 2022-07-30 DIAGNOSIS — F900 Attention-deficit hyperactivity disorder, predominantly inattentive type: Secondary | ICD-10-CM | POA: Diagnosis not present

## 2022-07-30 DIAGNOSIS — F4010 Social phobia, unspecified: Secondary | ICD-10-CM

## 2022-07-30 MED ORDER — PAROXETINE HCL 20 MG PO TABS: 20.0000 mg | ORAL_TABLET | Freq: Every day | ORAL | 0 refills | Status: DC

## 2022-07-30 NOTE — Progress Notes (Signed)
[facility] Follow up visit  Patient Identification: Kyle Ward MRN:  [id number] Date of Evaluation:  07/30/2022 Referral Source: Dr. Toy Cookey psychiatrist Chief Complaint:  anxiety, performance anxiety No chief complaint on file. inattention Visit Diagnosis:    ICD-10-CM   1. Panic disorder  F41.0     2. Attention deficit hyperactivity disorder (ADHD), predominantly inattentive type  F90.0     3. Social anxiety disorder  F40.10     Virtual Visit via Video Note  I connected with Kyle Ward on 07/30/22 at  8:30 AM EDT by a video enabled telemedicine application and verified that I am speaking with the correct person using two identifiers.  Location: Patient: home Provider: home office   I discussed the limitations of evaluation and management by telemedicine and the availability of in person appointments. The patient expressed understanding and agreed to proceed.      I discussed the assessment and treatment plan with the patient. The patient was provided an opportunity to ask questions and all were answered. The patient agreed with the plan and demonstrated an understanding of the instructions.   The patient was advised to call back or seek an in-person evaluation if the symptoms worsen or if the condition fails to improve as anticipated.  I provided 15 - 20 minutes of non-face-to-face time during this encounter, including chart review and documentation   History of Present Illness: Patient is a 31 years old currently married Caucasian male initially referred by psychiatrist Dr. 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  Last visit increased paxil to '15mg'$  helping with panic, performance anxiety. Inderal didn't work that well made him feel cold Seldom takes  valium Adderall at '10mg'$  works more often but he has struggled to keep the low dose handling it fair.    Denies excessive use of caffeine  Aggravating factors;performance anxiety Modifying factors; his dog, his family, marriage, job  Duration more than 6 years Severity improving  Past Psychiatric History: anxiety, adhd  Previous Psychotropic Medications: Yes   Substance Abuse History in the last 12 months:  Yes.    Consequences of Substance Abuse: Discussed effect of alcohol use to depression, anxiety and medications, judjement  Past Medical History:  Past Medical History:  Diagnosis Date   Anxiety     Past Surgical History:  Procedure Laterality Date   ELBOW SURGERY      Family Psychiatric History: Grand PA: Bipolar  Family History:  Family History  Problem Relation Age of Onset   Liver disease Father    Breast cancer Mother    Diabetes Mother    Stroke Maternal Grandmother    Bipolar disorder Paternal Grandfather     Social History:   Social History   Socioeconomic History   Marital status: Single    Spouse name: Not on file   Number of children: Not on file   Years of education: Not on file   Highest education level: Not on file  Occupational History   Not on file  Tobacco Use   Smoking status: Former   Smokeless tobacco: Never   Tobacco comments:    Smoked during college.    Substance and Sexual Activity   Alcohol use: Yes    Alcohol/week: 0.0 standard drinks  of alcohol    Comment: 2 servings of hard cider weekly   Drug use: No   Sexual activity: Not on file  Other Topics Concern   Not on file  Social History Narrative   Engaged to be married.     Social Determinants of Health   Financial Resource Strain: Not on file  Food Insecurity: Not on file  Transportation Needs: Not on file  Physical Activity: Not on file  Stress: Not on file  Social Connections: Not on file    Additional Social History: grew up with parents, good no abuse,  graduated college  Allergies:  No Known Allergies  Metabolic Disorder Labs: No results found for: "HGBA1C", "MPG" No results found for: "PROLACTIN" No results found for: "CHOL", "TRIG", "HDL", "CHOLHDL", "VLDL", "LDLCALC" Lab Results  Component Value Date   TSH 1.048 05/17/2015    Therapeutic Level Labs: No results found for: "LITHIUM" No results found for: "CBMZ" No results found for: "VALPROATE"  Current Medications: Current Outpatient Medications  Medication Sig Dispense Refill   amoxicillin-clavulanate (AUGMENTIN) 875-125 MG tablet Take 1 tablet by mouth 2 (two) times daily. 20 tablet 0   amphetamine-dextroamphetamine (ADDERALL) 10 MG tablet Take 1 tablet (10 mg total) by mouth daily. 30 tablet 0   diazepam (VALIUM) 10 MG tablet Take 1 tablet (10 mg total) by mouth daily as needed for anxiety. 15 tablet 0   meloxicam (MOBIC) 15 MG tablet Take 0.5-1 tablets (7.5-15 mg total) by mouth daily as needed. (Patient not taking: Reported on 05/24/2020) 30 tablet 2   PARoxetine (PAXIL) 20 MG tablet Take 1 tablet (20 mg total) by mouth daily. 30 tablet 0   propranolol (INDERAL) 10 MG tablet Take 1 tablet (10 mg total) by mouth daily as needed. 10 tablet 0   No current facility-administered medications for this visit.     Psychiatric Specialty Exam: Review of Systems  Cardiovascular:  Negative for chest pain.  Skin:  Negative for rash.  Neurological:  Negative for tremors.  Psychiatric/Behavioral:  Negative for agitation.     There were no vitals taken for this visit.There is no height or weight on file to calculate BMI.  General Appearance: Fairly Groomed  Eye Contact:  Good  Speech:  Slow  Volume:  Decreased  Mood:  Euthymic, fair  Affect:  Congruent  Thought Process:  Goal Directed  Orientation:  Full (Time, Place, and Person)  Thought Content:  Rumination  Suicidal Thoughts:  No  Homicidal Thoughts:  No  Memory:  Immediate;   Fair  Judgement:  Fair  Insight:  Fair   Psychomotor Activity:  Normal  Concentration:  Concentration: Fair  Recall:  [employer] of [place]: Good  Akathisia:  No  Handed:    AIMS (if indicated):  not done  Assets:  Desire for Improvement Financial Resources/Insurance Housing  ADL's:  Intact  Cognition: WNL  Sleep:  Fair   Screenings: [place] Office Visit from 05/02/2022 in [place] Office Visit from 10/21/2019 in [facility]  PHQ-2 Total Score 0 0      Flowsheet Row Video Visit from 07/30/2022 in [place] Video Visit from 06/04/2022 in [place] Office Visit from 05/02/2022 in [place] No Risk No Risk No Risk       Assessment and Plan: as follows  Prior documentation reviewed  Panic disorder; improving, wants to increase to '20mg'$  so does not have to cut paxil to half , change to '20mg'$    Social anxiety disorder; improving, increase paxil to '20mg'$   ADHD; manageable with some struggle days, continue '10mg'$  adderall, discussed drug holidays so can have extra for a week  Performance anxiety:improved, continue paxil and increase dose  No side effects  Collaboration of Care: Other read Dr. Toy Cookey referral and reviewed notes  Patient/Guardian was advised Release of Information must be obtained prior to any record release in order to collaborate their care with an outside provider. Patient/Guardian was advised if they have not already done so to contact the registration department to sign all necessary forms in order for [place] to release information regarding their care.   Consent: Patient/Guardian gives verbal consent for treatment and assignment of benefits for services provided during this visit. Patient/Guardian expressed understanding and agreed to proceed.   Merian Capron,  MD 10/25/20238:45 AM

## 2022-08-18 ENCOUNTER — Telehealth (HOSPITAL_COMMUNITY): Payer: Self-pay

## 2022-08-18 MED ORDER — AMPHETAMINE-DEXTROAMPHETAMINE 10 MG PO TABS: 10.0000 mg | ORAL_TABLET | Freq: Every day | ORAL | 0 refills | Status: DC

## 2022-08-18 NOTE — Telephone Encounter (Signed)
Patient called to get a refill on Adderall sent to [facility] Last refill 09/18 Next ov 12/20

## 2022-08-23 ENCOUNTER — Other Ambulatory Visit (HOSPITAL_COMMUNITY): Payer: Self-pay | Admitting: Psychiatry

## 2022-08-25 ENCOUNTER — Telehealth (HOSPITAL_COMMUNITY): Payer: Self-pay

## 2022-08-25 MED ORDER — DIAZEPAM 10 MG PO TABS
ORAL_TABLET | ORAL | 0 refills | Status: DC
Start: 2022-08-25 — End: 2022-09-12

## 2022-08-25 NOTE — Telephone Encounter (Signed)
Ok, I just canceled it at [facility]

## 2022-08-25 NOTE — Telephone Encounter (Signed)
He is already out of town and will be for 2 weeks. Do you want me to tell him to wait to fill it when he comes back home?

## 2022-08-25 NOTE — Telephone Encounter (Signed)
Patient will be out of town for 2 weeks and wants to know if you can send Diazepam to [place]. The pharmacy is in the chart.  [facility] [address], [place], [place]

## 2022-09-12 ENCOUNTER — Telehealth (HOSPITAL_COMMUNITY): Payer: Self-pay

## 2022-09-12 MED ORDER — DIAZEPAM 10 MG PO TABS: ORAL_TABLET | ORAL | 1 refills | Status: DC

## 2022-09-12 NOTE — Telephone Encounter (Signed)
Patient called to get a refill on Diazepam sent to [facility] on [address] in [place] Next ov 12/20

## 2022-09-15 ENCOUNTER — Other Ambulatory Visit (HOSPITAL_COMMUNITY): Payer: Self-pay | Admitting: Psychiatry

## 2022-09-15 ENCOUNTER — Telehealth (HOSPITAL_COMMUNITY): Payer: Self-pay | Admitting: *Deleted

## 2022-09-15 MED ORDER — PAROXETINE HCL 20 MG PO TABS
20.0000 mg | ORAL_TABLET | Freq: Every day | ORAL | 0 refills | Status: DC
Start: 2022-09-15 — End: 2022-10-15

## 2022-09-15 NOTE — Addendum Note (Signed)
Addended by: Merian Capron on: 09/15/2022 11:23 AM   Modules accepted: Orders

## 2022-09-15 NOTE — Telephone Encounter (Signed)
Patient request refills sent to :::  [facility], [address]   PARoxetine (PAXIL) 20 MG tablet  amphetamine-dextroamphetamine (ADDERALL) 10 MG tablet   Next Appt:  09/24/22 Last Appt:  07/17/22

## 2022-09-24 ENCOUNTER — Telehealth (INDEPENDENT_AMBULATORY_CARE_PROVIDER_SITE_OTHER): Payer: 59 | Admitting: Psychiatry

## 2022-09-24 ENCOUNTER — Encounter (HOSPITAL_COMMUNITY): Payer: Self-pay | Admitting: Psychiatry

## 2022-09-24 DIAGNOSIS — F410 Panic disorder [episodic paroxysmal anxiety] without agoraphobia: Secondary | ICD-10-CM

## 2022-09-24 DIAGNOSIS — F4010 Social phobia, unspecified: Secondary | ICD-10-CM | POA: Diagnosis not present

## 2022-09-24 DIAGNOSIS — F900 Attention-deficit hyperactivity disorder, predominantly inattentive type: Secondary | ICD-10-CM

## 2022-09-24 MED ORDER — AMPHETAMINE-DEXTROAMPHETAMINE 12.5 MG PO TABS: 12.5000 mg | ORAL_TABLET | Freq: Every day | ORAL | 0 refills | Status: DC

## 2022-09-24 NOTE — Progress Notes (Signed)
[facility] Follow up visit  Patient Identification: Kyle Ward MRN:  [id number] Date of Evaluation:  09/24/2022 Referral Source: Dr. Toy Cookey psychiatrist Chief Complaint:  anxiety, performance anxiety, inattention No chief complaint on file. inattention Visit Diagnosis:    ICD-10-CM   1. Panic disorder  F41.0     2. Social anxiety disorder  F40.10     3. Attention deficit hyperactivity disorder (ADHD), predominantly inattentive type  F90.0     Virtual Visit via Video Note  I connected with Kyle Ward on 09/24/22 at  8:30 AM EST by a video enabled telemedicine application and verified that I am speaking with the correct person using two identifiers.  Location: Patient: home Provider: home office   I discussed the limitations of evaluation and management by telemedicine and the availability of in person appointments. The patient expressed understanding and agreed to proceed.      I discussed the assessment and treatment plan with the patient. The patient was provided an opportunity to ask questions and all were answered. The patient agreed with the plan and demonstrated an understanding of the instructions.   The patient was advised to call back or seek an in-person evaluation if the symptoms worsen or if the condition fails to improve as anticipated.  I provided 20 minutes of non-face-to-face time during this encounter, including chart review and documentation   History of Present Illness: Patient is a 31 years old currently married Caucasian male initially referred by psychiatrist Dr. 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    Consequences of Substance Abuse: Discussed effect of alcohol use to depression, anxiety and medications, judjement  Past Medical History:  Past Medical History:  Diagnosis Date   Anxiety     Past Surgical History:  Procedure Laterality Date   ELBOW SURGERY      Family Psychiatric History: Grand PA: Bipolar  Family History:  Family History  Problem Relation Age of Onset   Liver disease Father    Breast cancer Mother    Diabetes Mother    Stroke Maternal Grandmother    Bipolar disorder Paternal Grandfather     Social History:   Social History   Socioeconomic History   Marital status: Single    Spouse name: Not on file   Number of children: Not on file   Years of education: Not on file   Highest education level: Not on file  Occupational History   Not on file  Tobacco Use   Smoking status: Former   Smokeless tobacco: Never   Tobacco comments:    Smoked during college.    Substance and Sexual Activity   Alcohol use: Yes    Alcohol/week: 0.0 standard drinks of alcohol    Comment: 2 servings of hard cider weekly   Drug use: No   Sexual activity: Not on  file  Other Topics Concern   Not on file  Social History Narrative   Engaged to be married.     Social Determinants of Health   Financial Resource Strain: Not on file  Food Insecurity: Not on file  Transportation Needs: Not on file  Physical Activity: Not on file  Stress: Not on file  Social Connections: Not on file    Additional Social History: grew up with parents, good no abuse, graduated college  Allergies:  No Known Allergies  Metabolic  Disorder Labs: No results found for: "HGBA1C", "MPG" No results found for: "PROLACTIN" No results found for: "CHOL", "TRIG", "HDL", "CHOLHDL", "VLDL", "LDLCALC" Lab Results  Component Value Date   TSH 1.048 05/17/2015    Therapeutic Level Labs: No results found for: "LITHIUM" No results found for: "CBMZ" No results found for: "VALPROATE"  Current Medications: Current Outpatient Medications  Medication Sig Dispense Refill   amoxicillin-clavulanate (AUGMENTIN) 875-125 MG tablet Take 1 tablet by mouth 2 (two) times daily. 20 tablet 0   amphetamine-dextroamphetamine (ADDERALL) 12.5 MG tablet Take 1 tablet by mouth daily. 30 tablet 0   meloxicam (MOBIC) 15 MG tablet Take 0.5-1 tablets (7.5-15 mg total) by mouth daily as needed. (Patient not taking: Reported on 05/24/2020) 30 tablet 2   PARoxetine (PAXIL) 20 MG tablet Take 1 tablet (20 mg total) by mouth daily. 30 tablet 0   No current facility-administered medications for this visit.     Psychiatric Specialty Exam: Review of Systems  Cardiovascular:  Negative for chest pain.  Skin:  Negative for rash.  Neurological:  Negative for tremors.  Psychiatric/Behavioral:  Negative for agitation.     There were no vitals taken for this visit.There is no height or weight on file to calculate BMI.  General Appearance: Fairly Groomed  Eye Contact:  Good  Speech:  Slow  Volume:  Decreased  Mood:  Euthymic, fair  Affect:  Congruent  Thought Process:  Goal Directed  Orientation:  Full (Time, Place, and Person)  Thought Content:  Rumination  Suicidal Thoughts:  No  Homicidal Thoughts:  No  Memory:  Immediate;   Fair  Judgement:  Fair  Insight:  Fair  Psychomotor Activity:  Normal  Concentration:  Concentration: Fair  Recall:  [employer] of [place]: Good  Akathisia:  No  Handed:    AIMS (if indicated):  not done  Assets:  Desire for Improvement Financial Resources/Insurance Housing  ADL's:  Intact  Cognition: WNL   Sleep:  Fair   Screenings: [place] Office Visit from 05/02/2022 in [place] Office Visit from 10/21/2019 in [facility]  PHQ-2 Total Score 0 0      Flowsheet Row Video Visit from 07/30/2022 in [facility] Video Visit from 06/04/2022 in [place] Office Visit from 05/02/2022 in [facility] No Risk No Risk No Risk       Assessment and Plan: as follows Prior documentation reviewed   Panic disorder; improved continue paxil '20mg'$     Social anxiety disorder;better continue paxil '20mg'$ , call for refills   ADHD; some struggle, can take adderall '10mg'$  but when ready for refill will send 12.'5mg'$ , can call for early refill since dose may need be more  Reviewed meds Seldom takes valium or inderal for anxiety any more Fu 2 -60m Collaboration of Care: Other read Dr. FToy Cookey  referral and reviewed notes  Patient/Guardian was advised Release of Information must be obtained prior to any record release in order to collaborate their care with an outside provider. Patient/Guardian was advised if they have not already done so to contact the registration department to sign all necessary forms in order for [place] to release information regarding their care.   Consent: Patient/Guardian gives verbal consent for treatment and assignment of benefits for services provided during this visit. Patient/Guardian expressed understanding and agreed to proceed.   Merian Capron, MD 12/20/20238:38 AM

## 2022-10-02 ENCOUNTER — Telehealth (HOSPITAL_COMMUNITY): Payer: Self-pay

## 2022-10-02 DIAGNOSIS — F900 Attention-deficit hyperactivity disorder, predominantly inattentive type: Secondary | ICD-10-CM

## 2022-10-02 MED ORDER — AMPHETAMINE-DEXTROAMPHETAMINE 12.5 MG PO TABS: 12.5000 mg | ORAL_TABLET | Freq: Every morning | ORAL | 0 refills | Status: DC

## 2022-10-02 NOTE — Telephone Encounter (Signed)
Medication refill request - Telephone call with patient, after he left a message he was in need of a new order for his changed Adderall to 12.5 mg per day. Verified with pt's [facility] he filled 10 mg, #30 on 09/15/22 and then by record review, Dr. De Nurse increased the dosage to 12.5 mg on 09/24/22 but had not sent in a new order as of this date.  Questioned if patient started quartering his original 10 mg tablets after the increase on 09/24/22.  Patient stated he had tried but this was difficult to do with the tablets and took more than a quarter on several days and less on others. Patient stated he would be out after today so informed him this request would be sent to Dr. Shea Evans, covering while Dr. De Nurse is out this week. Informed patient she may or may not be willing to send in the new order but did verify with patient's [facility] they would be able to fill if a new dosage sent.  Patient returns next onm 12/24/22.

## 2022-10-02 NOTE — Telephone Encounter (Signed)
I have sent Adderall 12.5 mg - 7 days supply to [facility].  Will route this message to Dr. De Nurse to address once back in office.

## 2022-10-03 NOTE — Telephone Encounter (Signed)
Medication management - Telephone call with patient to inform Dr.Eappen, covering for Dr. Olena Heckle out this week, had sent in a 7 day supply of his newly ordered Adderall 12.5 mg and informed patient to call back in the coming week to get his next order when due by Dr. De Nurse.  Patient agreed with plan and informed the 7 day order was e-scribed to his [facility].

## 2022-10-03 NOTE — Telephone Encounter (Addendum)
Patient called today as follow up to yesterday's early refill request. Stated he wanted provider to know that he checked his bottle of diazepam and found that half of it was missing. States he travelled over the holidays and had this and his Adderrall in his backpack so he speculates someone may have "taken" the Adderall as well which would explain why he is out of adderall before he should be.

## 2022-10-07 ENCOUNTER — Telehealth (HOSPITAL_COMMUNITY): Payer: Self-pay

## 2022-10-07 MED ORDER — DIAZEPAM 10 MG PO TABS: 10.0000 mg | ORAL_TABLET | Freq: Every day | ORAL | 0 refills | Status: DC | PRN

## 2022-10-07 NOTE — Telephone Encounter (Signed)
Patient called to request a refill of the Diazepam '10mg'$ 

## 2022-10-07 NOTE — Telephone Encounter (Signed)
Patient called to request a refill of the Diazepam '10mg'$   Last visit 09/24/22 Next visit 12/24/22    [place], [place] Phone: [phone]  Fax: [phone]

## 2022-10-08 ENCOUNTER — Telehealth (HOSPITAL_COMMUNITY): Payer: Self-pay | Admitting: *Deleted

## 2022-10-08 DIAGNOSIS — F900 Attention-deficit hyperactivity disorder, predominantly inattentive type: Secondary | ICD-10-CM

## 2022-10-08 MED ORDER — AMPHETAMINE-DEXTROAMPHETAMINE 12.5 MG PO TABS: 12.5000 mg | ORAL_TABLET | Freq: Every morning | ORAL | 0 refills | Status: DC

## 2022-10-08 NOTE — Telephone Encounter (Signed)
PATIENT CALLED FOR REFILL STATED HE HAS ONE PILL LEFT AFTER SCRIPT SENT :: Ursula Alert, MD Watt Climes, RN     Disp Refills Start End   amphetamine-dextroamphetamine (ADDERALL)                     12.5 MG tablet 7 tablet 0 10/02/2022 10/09/2022   Sig - Route: Take 1 tablet by mouth in the  morning for 7 days. - Oral Last appt 09/24/22 Next appt 12/24/22

## 2022-10-08 NOTE — Addendum Note (Signed)
Addended by: Merian Capron on: 10/08/2022 11:59 AM   Modules accepted: Orders

## 2022-10-15 ENCOUNTER — Other Ambulatory Visit (HOSPITAL_COMMUNITY): Payer: Self-pay | Admitting: Psychiatry

## 2022-11-03 ENCOUNTER — Telehealth (HOSPITAL_COMMUNITY): Payer: Self-pay

## 2022-11-03 DIAGNOSIS — F900 Attention-deficit hyperactivity disorder, predominantly inattentive type: Secondary | ICD-10-CM

## 2022-11-03 MED ORDER — AMPHETAMINE-DEXTROAMPHETAMINE 12.5 MG PO TABS: 12.5000 mg | ORAL_TABLET | Freq: Every morning | ORAL | 0 refills | Status: DC

## 2022-11-03 NOTE — Telephone Encounter (Signed)
Medication problem - Message left for patient, after speaking to Phamacist at [facility], per Dr. De Nurse to allow patient to fill his Adderall 12.5 mg order 3 days early. Informed per pharmacist to patient that he could fill this at the earliest 11/04/22 for insurance to cover and also that this could be filled early only 1 time per year.  Therefore, reminded patient to not take more than prescribed daily, no more than 1 a day and that he is also responsible for keeping up with his medications so they do not get washed, lost, or taken early.  Informed Dr. De Nurse approved this one time fill early for 11/04/22 but would not be filling early in the future.  Requested patient call back if any questions.

## 2022-11-03 NOTE — Telephone Encounter (Signed)
Medication problem- Patient called back and left a message requesting you approve him to fill 3 days early as stated he took 2 a day several times the previous month and lost 1 tablet in his wash.

## 2022-11-03 NOTE — Telephone Encounter (Signed)
Medication refill - Patient left a message he is in need of a new Adderall 12.5 mg order, last provided 10/08/22 and patient return 12/24/22. Patient requests order be sent to his [facility] and to have permission to fill 2 days early.

## 2022-11-07 ENCOUNTER — Telehealth (HOSPITAL_COMMUNITY): Payer: Self-pay | Admitting: Psychiatry

## 2022-11-07 MED ORDER — DIAZEPAM 10 MG PO TABS: 10.0000 mg | ORAL_TABLET | Freq: Every day | ORAL | 1 refills | Status: DC | PRN

## 2022-11-07 NOTE — Telephone Encounter (Signed)
Patient called requesting refill of:  diazepam (VALIUM) 10 MG tablet   [place], [place] (Ph: [phone])   Last ordered: 10/07/2022 - 15 tablets Last visit: 09/24/2022 Next visit: 12/24/2022

## 2022-11-22 ENCOUNTER — Other Ambulatory Visit (HOSPITAL_COMMUNITY): Payer: Self-pay | Admitting: Psychiatry

## 2022-11-24 ENCOUNTER — Telehealth (HOSPITAL_COMMUNITY): Payer: Self-pay | Admitting: *Deleted

## 2022-11-24 NOTE — Telephone Encounter (Signed)
Patient started leaving VM on Saturday 11/22/22 About refill for PARoxetine (PAXIL) 20 MG tablet  And called LVM everyday up today Monday 11/24/22 Total of 6 messages   LVM to f/u with Rx refill sent  on    Ordered on: 11/24/22

## 2022-11-28 ENCOUNTER — Telehealth (HOSPITAL_COMMUNITY): Payer: Self-pay | Admitting: *Deleted

## 2022-11-28 MED ORDER — DIAZEPAM 10 MG PO TABS: 10.0000 mg | ORAL_TABLET | Freq: Every day | ORAL | 1 refills | Status: DC | PRN

## 2022-11-28 NOTE — Addendum Note (Signed)
Addended by: Merian Capron on: 11/28/2022 09:59 AM   Modules accepted: Orders

## 2022-11-28 NOTE — Telephone Encounter (Signed)
PATIENT REQUESTED REFILL-[place], [address]  diazepam (VALIUM) 10 MG tablet   LAST VISIT  09/24/22 NEXT VISIT   12/24/22

## 2022-12-03 ENCOUNTER — Telehealth (HOSPITAL_COMMUNITY): Payer: Self-pay

## 2022-12-03 DIAGNOSIS — F900 Attention-deficit hyperactivity disorder, predominantly inattentive type: Secondary | ICD-10-CM

## 2022-12-03 MED ORDER — AMPHETAMINE-DEXTROAMPHETAMINE 12.5 MG PO TABS: 12.5000 mg | ORAL_TABLET | Freq: Every morning | ORAL | 0 refills | Status: DC

## 2022-12-03 NOTE — Telephone Encounter (Signed)
Medication refill request - Call message from patient requesting a new Adderall 12.5 mg order be sent into his [facility] so he could fill this on 12/04/22 when due. Medication last ordered 11/03/22 and pt returns 12/24/22.

## 2022-12-04 NOTE — Telephone Encounter (Signed)
Medication management - Message left for patient that Dr. De Nurse had sent in his requested new Adderall 12.5 mg order to his [place] and to call back if any issues.

## 2022-12-24 ENCOUNTER — Encounter (HOSPITAL_COMMUNITY): Payer: Self-pay | Admitting: Psychiatry

## 2022-12-24 ENCOUNTER — Telehealth (INDEPENDENT_AMBULATORY_CARE_PROVIDER_SITE_OTHER): Payer: 59 | Admitting: Psychiatry

## 2022-12-24 DIAGNOSIS — F410 Panic disorder [episodic paroxysmal anxiety] without agoraphobia: Secondary | ICD-10-CM

## 2022-12-24 DIAGNOSIS — F900 Attention-deficit hyperactivity disorder, predominantly inattentive type: Secondary | ICD-10-CM | POA: Diagnosis not present

## 2022-12-24 DIAGNOSIS — F4010 Social phobia, unspecified: Secondary | ICD-10-CM

## 2022-12-24 MED ORDER — PAROXETINE HCL 20 MG PO TABS: 20.0000 mg | ORAL_TABLET | Freq: Every day | ORAL | 1 refills | Status: DC

## 2022-12-24 MED ORDER — AMPHETAMINE-DEXTROAMPHETAMINE 12.5 MG PO TABS: 12.5000 mg | ORAL_TABLET | Freq: Every morning | ORAL | 0 refills | Status: DC

## 2022-12-24 NOTE — Progress Notes (Signed)
[facility] Follow up visit  Patient Identification: [facility] MRN:  [id number] Date of Evaluation:  12/24/2022 Referral Source: Dr. Toy Cookey psychiatrist Chief Complaint:  anxiety, performance anxiety, inattention No chief complaint on file. inattention Visit Diagnosis:    ICD-10-CM   1. Attention deficit hyperactivity disorder (ADHD), predominantly inattentive type  F90.0 amphetamine-dextroamphetamine (ADDERALL) 12.5 MG tablet    2. Panic disorder  F41.0     3. Social anxiety disorder  F40.10     Virtual Visit via Video Note  I connected with Noemi Brissey Gainer on 12/24/22 at  8:30 AM EDT by a video enabled telemedicine application and verified that I am speaking with the correct person using two identifiers.  Location: Patient: parked car Provider: home office   I discussed the limitations of evaluation and management by telemedicine and the availability of in person appointments. The patient expressed understanding and agreed to proceed.     I discussed the assessment and treatment plan with the patient. The patient was provided an opportunity to ask questions and all were answered. The patient agreed with the plan and demonstrated an understanding of the instructions.   The patient was advised to call back or seek an in-person evaluation if the symptoms worsen or if the condition fails to improve as anticipated.  I provided 15 minutes of non-face-to-face time during this encounter.     History of Present Illness: Patient is a 32 years old currently married Caucasian male initially referred by psychiatrist Dr. 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  Married 1 year ago   Doing fair and anxiety manageable with paxil, prn valium  Less panic attacks  Lost adderall when visiting friend , understands the abuse potential  Expecting baby in 2 -7months  Overall mood and depression stable  No headaches, chest  pain   Aggravating factors;performance anxiety Modifying factors; dog, his family, marriage, job  Duration more than 6 years Severity better   Past Psychiatric History: anxiety, adhd  Previous Psychotropic Medications: Yes   Substance Abuse History in the last 12 months:  Yes.    Consequences of Substance Abuse: Discussed effect of alcohol use to depression, anxiety and medications, judjement  Past Medical History:  Past Medical History:  Diagnosis Date   Anxiety     Past Surgical History:  Procedure Laterality Date   ELBOW SURGERY      Family Psychiatric History: Grand PA: Bipolar  Family History:  Family History  Problem Relation Age of Onset   Liver disease Father    Breast cancer Mother    Diabetes Mother    Stroke Maternal Grandmother    Bipolar disorder Paternal Grandfather     Social History:   Social History   Socioeconomic History   Marital status: Married    Spouse name: Not on file   Number of children: Not on file   Years of education: Not on file   Highest education level: Not on file  Occupational History   Not on file  Tobacco Use   Smoking status: Former   Smokeless tobacco: Never   Tobacco comments:    Smoked during college.    Substance and Sexual Activity   Alcohol use: Yes    Alcohol/week: 0.0 standard drinks of alcohol    Comment: 2 servings of hard cider weekly   Drug use: No   Sexual activity: Not on file  Other Topics Concern  Not on file  Social History Narrative   Engaged to be married.     Social Determinants of Health   Financial Resource Strain: Not on file  Food Insecurity: Not on file  Transportation Needs: Not on file  Physical Activity: Not on file  Stress: Not on file  Social Connections: Not on file    Additional Social History: grew up with parents, good no abuse, graduated college  Allergies:  No Known Allergies  Metabolic Disorder Labs: No results found for: "HGBA1C", "MPG" No results found for:  "PROLACTIN" No results found for: "CHOL", "TRIG", "HDL", "CHOLHDL", "VLDL", "LDLCALC" Lab Results  Component Value Date   TSH 1.048 05/17/2015    Therapeutic Level Labs: No results found for: "LITHIUM" No results found for: "CBMZ" No results found for: "VALPROATE"  Current Medications: Current Outpatient Medications  Medication Sig Dispense Refill   amoxicillin-clavulanate (AUGMENTIN) 875-125 MG tablet Take 1 tablet by mouth 2 (two) times daily. 20 tablet 0   amphetamine-dextroamphetamine (ADDERALL) 12.5 MG tablet Take 1 tablet by mouth in the morning. 30 tablet 0   diazepam (VALIUM) 10 MG tablet Take 1 tablet (10 mg total) by mouth daily as needed for anxiety. 15 tablet 1   meloxicam (MOBIC) 15 MG tablet Take 0.5-1 tablets (7.5-15 mg total) by mouth daily as needed. (Patient not taking: Reported on 05/24/2020) 30 tablet 2   PARoxetine (PAXIL) 20 MG tablet Take 1 tablet (20 mg total) by mouth daily. 30 tablet 1   No current facility-administered medications for this visit.     Psychiatric Specialty Exam: Review of Systems  Cardiovascular:  Negative for chest pain.  Skin:  Negative for rash.  Neurological:  Negative for tremors.  Psychiatric/Behavioral:  Negative for agitation.     There were no vitals taken for this visit.There is no height or weight on file to calculate BMI.  General Appearance: Fairly Groomed  Eye Contact:  Good  Speech:  Slow  Volume:  Decreased  Mood:  Euthymic, fair  Affect:  Congruent  Thought Process:  Goal Directed  Orientation:  Full (Time, Place, and Person)  Thought Content:  Rumination  Suicidal Thoughts:  No  Homicidal Thoughts:  No  Memory:  Immediate;   Fair  Judgement:  Fair  Insight:  Fair  Psychomotor Activity:  Normal  Concentration:  Concentration: Fair  Recall:  [employer] [place]: Good  Akathisia:  No  Handed:    AIMS (if indicated):  not done  Assets:  Desire for Improvement Financial  Resources/Insurance Housing  ADL's:  Intact  Cognition: WNL  Sleep:  Fair   Screenings: IT sales professional Office Visit from 05/02/2022 in [place] at [facility] Visit from 10/21/2019 in [place] at [facility] Total Score 0 0      Flowsheet Row Video Visit from 07/30/2022 in [place] at [facility] Video Visit from 06/04/2022 in [place] at [facility] Visit from 05/02/2022 in [place] at [facility] No Risk No Risk No Risk       Assessment and Plan: as follows  Prior documentation reviewed   Panic disorder; manageable conitnue paxil, valium is prn    Social anxiety disorder;fair continue paxil  ADHD; manageable with adderall 12.5mg  refills sent Reviewed meds Seldom takes valium or inderal for anxiety any more Fu 56m.  Collaboration of Care: Other read Dr. Toy Cookey referral and reviewed notes  Patient/Guardian was advised Release of Information must be obtained prior to any record release in order to collaborate their care with an outside provider. Patient/Guardian was advised if they have not already done so to contact the registration department to sign all necessary forms in order for [place] to release information regarding their care.   Consent: Patient/Guardian gives verbal consent for treatment and assignment of benefits for services provided during this visit. Patient/Guardian expressed understanding and agreed to proceed.   Merian Capron, MD 3/20/20248:45 AM

## 2022-12-29 ENCOUNTER — Telehealth (HOSPITAL_COMMUNITY): Payer: Self-pay

## 2022-12-29 NOTE — Telephone Encounter (Signed)
Medication management - Telephone call with patient, after he left a message requesting a new Adderall 12.5 mg order, to inform patient Dr. De Nurse had already sent this order in on 12/24/22 to pt's [facility]. Pt to call back if an issues filling.

## 2023-01-12 ENCOUNTER — Telehealth (HOSPITAL_COMMUNITY): Payer: Self-pay | Admitting: Psychiatry

## 2023-01-12 MED ORDER — DIAZEPAM 10 MG PO TABS: 10.0000 mg | ORAL_TABLET | Freq: Every day | ORAL | 1 refills | Status: DC | PRN

## 2023-01-12 NOTE — Telephone Encounter (Signed)
Patient called requesting refill of:  diazepam (VALIUM) 10 MG tablet    [facility] [place], [place] - [address] [place] (Ph: [phone])   Last ordered: 11/28/2022 - 15 tablets with one refill  Last visit: 12/24/2022  Next visit: 06/24/2023

## 2023-01-22 ENCOUNTER — Other Ambulatory Visit (HOSPITAL_COMMUNITY): Payer: Self-pay | Admitting: Psychiatry

## 2023-01-23 ENCOUNTER — Telehealth (HOSPITAL_COMMUNITY): Payer: Self-pay

## 2023-01-23 DIAGNOSIS — F900 Attention-deficit hyperactivity disorder, predominantly inattentive type: Secondary | ICD-10-CM

## 2023-01-23 NOTE — Telephone Encounter (Signed)
Medication refill - Patient left a message he is in need of a new Adderall 12.5 mg order, last provided 12/23/22 and would like this to be sent in to the [facility] on [address]. Patient stated plans to be going out of town and would like to fill prior. Patient returns next on 06/24/23.

## 2023-01-26 MED ORDER — AMPHETAMINE-DEXTROAMPHETAMINE 12.5 MG PO TABS: 12.5000 mg | ORAL_TABLET | Freq: Every morning | ORAL | 0 refills | Status: DC

## 2023-01-27 ENCOUNTER — Telehealth (HOSPITAL_COMMUNITY): Payer: Self-pay

## 2023-01-27 DIAGNOSIS — F900 Attention-deficit hyperactivity disorder, predominantly inattentive type: Secondary | ICD-10-CM

## 2023-01-27 MED ORDER — AMPHETAMINE-DEXTROAMPHETAMINE 10 MG PO TABS: 10.0000 mg | ORAL_TABLET | Freq: Every day | ORAL | 0 refills | Status: DC

## 2023-01-27 NOTE — Telephone Encounter (Signed)
Medication problem - Patient left a message he had contacted all teh local pharmacies around [place], [place] and none of them carry the 12.5 mg dosage of Adderall he needs. Patient requested Dr. Gilmore Laroche send in a 10 mg prescription for 1 week while he is there to any pharmacy in the [place], [place] area and he will go to pick up the medication and then fill his 12. 5 mg order when he returns to town.

## 2023-01-27 NOTE — Telephone Encounter (Signed)
Medication problem - Message left for patient, after he left one that he was in [place], [place] and needed his Adderall 12.5 mg order sent to a pharmacy there. Informed pt Dr. Gilmore Laroche had sent this to his [facility] in [place] on 01/26/23 so if he needed this to be resent to a pharmacy there in [place], [place] he would need to locate one with the dosage needed and to call us back so it can be resent and the other prescription can be cancelled since he will be there all this week.  Requested pt call around to the local pharmacies there to locate the medication and then to leave this information for Dr. Gilmore Laroche to assist.

## 2023-01-28 NOTE — Telephone Encounter (Signed)
Medication management - Message left for patient that Dr. Gilmore Laroche had sent in a weeks supply of the requested Adderall 10 mg to the [facility] there in [place], [place] and to call our office back if any further issues obtaining.

## 2023-02-24 ENCOUNTER — Telehealth (HOSPITAL_COMMUNITY): Payer: Self-pay | Admitting: Psychiatry

## 2023-02-24 DIAGNOSIS — F900 Attention-deficit hyperactivity disorder, predominantly inattentive type: Secondary | ICD-10-CM

## 2023-02-24 MED ORDER — AMPHETAMINE-DEXTROAMPHETAMINE 12.5 MG PO TABS: 12.5000 mg | ORAL_TABLET | Freq: Every morning | ORAL | 0 refills | Status: DC

## 2023-02-24 MED ORDER — DIAZEPAM 10 MG PO TABS: 10.0000 mg | ORAL_TABLET | Freq: Every day | ORAL | 2 refills | Status: DC | PRN

## 2023-02-24 MED ORDER — PAROXETINE HCL 20 MG PO TABS: 20.0000 mg | ORAL_TABLET | Freq: Every day | ORAL | 2 refills | Status: DC

## 2023-02-24 NOTE — Telephone Encounter (Signed)
Patient called requesting refills of:   amphetamine-dextroamphetamine (ADDERALL) 12.5 MG tablet  Last ordered: 01/26/2023 - 30 tablets  diazepam (VALIUM) 10 MG tablet  Last ordered: 01/12/2023 15 tablets with 1 refill  PARoxetine (PAXIL) 20 MG tablet  Last ordered: 01/23/2023 - 30 tablets  [id number] [place], [place] - [address] AT [facility] [address] (Ph: [phone])   Last visit: 12/24/2022  Next visit: 06/24/2023

## 2023-03-23 ENCOUNTER — Telehealth (HOSPITAL_COMMUNITY): Payer: Self-pay | Admitting: *Deleted

## 2023-03-23 DIAGNOSIS — F900 Attention-deficit hyperactivity disorder, predominantly inattentive type: Secondary | ICD-10-CM

## 2023-03-23 MED ORDER — AMPHETAMINE-DEXTROAMPHETAMINE 12.5 MG PO TABS: 12.5000 mg | ORAL_TABLET | Freq: Every morning | ORAL | 0 refills | Status: DC

## 2023-03-23 NOTE — Addendum Note (Signed)
Addended by: Thresa Ross on: 03/23/2023 03:56 PM   Modules accepted: Orders

## 2023-03-23 NOTE — Telephone Encounter (Signed)
Amphetamine-dextroamphetamine (ADDERALL) 12.5 MG tablet Take 1 tablet by mouth in the morning. Dispense: 30 tablet, Refills: 0 ordered   02/24/2023 03/26/2023 Thresa Ross, MD           Summary: Take 1 tablet by mouth in the morning.,  Starting Tue 02/24/2023, Until Thu 03/26/2023, Normal Dose, Route, Frequency: 12.5 mg, Oral, Every morningDx Associated: Different Encounter: Start Date & Time: 05/21/2024End w/ Doses: 03/26/2023 after 30 dosesOrd/Sold: 02/24/2023 (O)Ordered On: 02/24/2023 ReportPharmacy: [id number]  [place], [place] - [address] AT [facility] OF [address]      LAST APPT  06/24/23 NEXT APPT  12/24/22

## 2023-04-01 ENCOUNTER — Encounter (HOSPITAL_COMMUNITY): Payer: Self-pay | Admitting: Psychiatry

## 2023-04-01 ENCOUNTER — Telehealth (INDEPENDENT_AMBULATORY_CARE_PROVIDER_SITE_OTHER): Payer: 59 | Admitting: Psychiatry

## 2023-04-01 DIAGNOSIS — F410 Panic disorder [episodic paroxysmal anxiety] without agoraphobia: Secondary | ICD-10-CM | POA: Diagnosis not present

## 2023-04-01 DIAGNOSIS — F418 Other specified anxiety disorders: Secondary | ICD-10-CM

## 2023-04-01 DIAGNOSIS — F4010 Social phobia, unspecified: Secondary | ICD-10-CM

## 2023-04-01 DIAGNOSIS — F900 Attention-deficit hyperactivity disorder, predominantly inattentive type: Secondary | ICD-10-CM | POA: Diagnosis not present

## 2023-04-01 MED ORDER — PAROXETINE HCL 30 MG PO TABS: 30.0000 mg | ORAL_TABLET | Freq: Every day | ORAL | 0 refills | Status: DC

## 2023-04-01 NOTE — Progress Notes (Signed)
[facility] Follow up visit  Patient Identification: DOMNICK CHERVENAK MRN:  [id number] Date of Evaluation:  04/01/2023 Referral Source: Dr. Toni Arthurs psychiatrist Chief Complaint:  anxiety, performance anxiety, inattention No chief complaint on file. inattention Visit Diagnosis:    ICD-10-CM   1. Panic disorder  F41.0     2. Social anxiety disorder  F40.10     3. Situational anxiety  F41.8     4. Attention deficit hyperactivity disorder (ADHD), predominantly inattentive type  F90.0        Virtual Visit via Video Note  I connected with Ransome Helwig Carneal on 04/01/23 at  8:30 AM EDT by a video enabled telemedicine application and verified that I am speaking with the correct person using two identifiers.  Location: Patient: parked car Provider: home office   I discussed the limitations of evaluation and management by telemedicine and the availability of in person appointments. The patient expressed understanding and agreed to proceed.      I discussed the assessment and treatment plan with the patient. The patient was provided an opportunity to ask questions and all were answered. The patient agreed with the plan and demonstrated an understanding of the instructions.   The patient was advised to call back or seek an in-person evaluation if the symptoms worsen or if the condition fails to improve as anticipated.  I provided 25 minutes of non-face-to-face time during this encounter.    History of Present Illness: Patient is a 32 years old currently married Caucasian male initially referred by psychiatrist Dr. 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  Married 2022   Was doing fair, but had palpitations , anxiety went thru cardiology check up everything came back normal. Pcp suggested possible anxiety coming for early follow up  Also adderall was increased few months back  I discussed it may be related to that increase  and anxiety ,also had not good job report performance that contributed to anxiety  Also had his baby last month, baby is doing good  Currently on leave and not sure if he wants to or could be able to do this job gets anxious of joining. Still has 2 weeks off    Aggravating factors;performance anxiety, job stress Modifying factors; dog, his family, marriage  Duration more than 6 years Severity anxious  Past Psychiatric History: anxiety, adhd  Previous Psychotropic Medications: Yes   Substance Abuse History in the last 12 months:  Yes.    Consequences of Substance Abuse: Discussed effect of alcohol use to depression, anxiety and medications, judjement  Past Medical History:  Past Medical History:  Diagnosis Date   Anxiety     Past Surgical History:  Procedure Laterality Date   ELBOW SURGERY      Family Psychiatric History: Grand PA: Bipolar  Family History:  Family History  Problem Relation Age of Onset   Liver disease Father    Breast cancer Mother    Diabetes Mother    Stroke Maternal Grandmother    Bipolar disorder Paternal Grandfather     Social History:   Social History   Socioeconomic History   Marital status: Married    Spouse name: Not on file   Number of children: Not on file   Years of education: Not on file   Highest education level: Not on file  Occupational History   Not on file  Tobacco Use   Smoking status: Former  Smokeless tobacco: Never   Tobacco comments:    Smoked during college.    Substance and Sexual Activity   Alcohol use: Yes    Alcohol/week: 0.0 standard drinks of alcohol    Comment: 2 servings of hard cider weekly   Drug use: No   Sexual activity: Not on file  Other Topics Concern   Not on file  Social History Narrative   Engaged to be married.     Social Determinants of Health   Financial Resource Strain: Not on file  Food Insecurity: Not on file  Transportation Needs: Not on file  Physical Activity: Not on file   Stress: Not on file  Social Connections: Not on file    Additional Social History: grew up with parents, good no abuse, graduated college  Allergies:  No Known Allergies  Metabolic Disorder Labs: No results found for: "HGBA1C", "MPG" No results found for: "PROLACTIN" No results found for: "CHOL", "TRIG", "HDL", "CHOLHDL", "VLDL", "LDLCALC" Lab Results  Component Value Date   TSH 1.048 05/17/2015    Therapeutic Level Labs: No results found for: "LITHIUM" No results found for: "CBMZ" No results found for: "VALPROATE"  Current Medications: Current Outpatient Medications  Medication Sig Dispense Refill   amoxicillin-clavulanate (AUGMENTIN) 875-125 MG tablet Take 1 tablet by mouth 2 (two) times daily. 20 tablet 0   amphetamine-dextroamphetamine (ADDERALL) 10 MG tablet Take 1 tablet (10 mg total) by mouth daily. 7 tablet 0   amphetamine-dextroamphetamine (ADDERALL) 12.5 MG tablet Take 1 tablet by mouth in the morning. 30 tablet 0   diazepam (VALIUM) 10 MG tablet Take 1 tablet (10 mg total) by mouth daily as needed for anxiety. 15 tablet 2   meloxicam (MOBIC) 15 MG tablet Take 0.5-1 tablets (7.5-15 mg total) by mouth daily as needed. (Patient not taking: Reported on 05/24/2020) 30 tablet 2   PARoxetine (PAXIL) 30 MG tablet Take 1 tablet (30 mg total) by mouth daily. 30 tablet 0   No current facility-administered medications for this visit.     Psychiatric Specialty Exam: Review of Systems  Cardiovascular:  Positive for palpitations. Negative for chest pain.  Skin:  Negative for rash.  Neurological:  Negative for tremors.  Psychiatric/Behavioral:  Negative for agitation. The patient is nervous/anxious.     There were no vitals taken for this visit.There is no height or weight on file to calculate BMI.  General Appearance: Fairly Groomed  Eye Contact:  Good  Speech:  Slow  Volume:  Decreased  Mood: anxiuos  Affect:  Congruent  Thought Process:  Goal Directed  Orientation:   Full (Time, Place, and Person)  Thought Content:  Rumination  Suicidal Thoughts:  No  Homicidal Thoughts:  No  Memory:  Immediate;   Fair  Judgement:  Fair  Insight:  Fair  Psychomotor Activity:  Normal  Concentration:  Concentration: Fair  Recall:  [employer] of Knowledge:Fair  Language: Good  Akathisia:  No  Handed:    AIMS (if indicated):  not done  Assets:  Desire for Improvement Financial Resources/Insurance Housing  ADL's:  Intact  Cognition: WNL  Sleep:  Fair   Screenings: Equities trader Office Visit from 05/02/2022 in [facility] Office Visit from 10/21/2019 in [facility] Total Score 0 0      Flowsheet Row Video Visit from 07/30/2022 in [facility] at [facility] Video Visit from 06/04/2022 in [facility] Office Visit from 05/02/2022 in [facility] at [facility]  C-SSRS RISK CATEGORY No Risk No Risk No Risk       Assessment and Plan: as follows  Prior documentation reviewed   Panic disorder; has had panic, palpitations in recent weeks, increase paxil to 30mg , can take valium at night, should cut down adderall dose , may consider job change as well, consider therapy  Social anxiety disorder; anxious, increase paxil as above  ADHD; manageable but I recommend to lower back adderall to 10 or lower considering history of palpitations, he remains somewhat reluctant will work on it, currently off work so can take drug holidays  Fu 100m. Or earlier if needed  Collaboration of Care: Other read Dr. Toni Arthurs referral and reviewed notes  Patient/Guardian was advised Release of Information must be obtained prior to any record release in order to collaborate their care with an outside provider. Patient/Guardian was advised if they have  not already done so to contact the registration department to sign all necessary forms in order for [place] to release information regarding their care.   Consent: Patient/Guardian gives verbal consent for treatment and assignment of benefits for services provided during this visit. Patient/Guardian expressed understanding and agreed to proceed.   Thresa Ross, MD 6/26/20248:48 AM

## 2023-04-03 ENCOUNTER — Encounter (HOSPITAL_COMMUNITY): Payer: Self-pay

## 2023-04-03 ENCOUNTER — Ambulatory Visit (INDEPENDENT_AMBULATORY_CARE_PROVIDER_SITE_OTHER): Payer: 59 | Admitting: Licensed Clinical Social Worker

## 2023-04-03 DIAGNOSIS — F900 Attention-deficit hyperactivity disorder, predominantly inattentive type: Secondary | ICD-10-CM | POA: Diagnosis not present

## 2023-04-03 DIAGNOSIS — F410 Panic disorder [episodic paroxysmal anxiety] without agoraphobia: Secondary | ICD-10-CM

## 2023-04-04 NOTE — Progress Notes (Signed)
Comprehensive Clinical Assessment (CCA) Note  04/04/2023 Kyle Ward [id number]  Chief Complaint:  Chief Complaint  Patient presents with   Anxiety   Visit Diagnosis: Panic disorder  Attention deficit hyperactivity disorder (ADHD), predominantly inattentive type     CCA Biopsychosocial Intake/Chief Complaint:  Anxiety, depression  Current Symptoms/Problems: Anxiety: anxiety/panic attacks at night, racing heart, diffiuculty with breathing, shut down,  social anxiety:feels looked at, judged, worries that he will embarss himself, feels worried, nervous, fearful, gained wieght: 15 lbs in a 1.5 months, feels like he is going faint, worries about health and palpatations, irritability, difficulty falling asleep, lack of confidence, Mood: can feel down, feelings of worthlessness at times, No SI, HI, no psychosis   Patient Reported Schizophrenia/Schizoaffective Diagnosis in Past: No   Strengths: can recognize what is going well, family orinted, kind person,  Preferences: doesn't prefer large crowds, doesn't prefer engaging with others at times  Abilities: empathetic, good listener, good with helping others, problem solving   Type of Services Patient Feels are Needed: Therapy, medication   Initial Clinical Notes/Concerns: Symptoms started around 6th grade but increased around age 3 when panic attacks started at night then in May 2024 he got a bad performance review,symptoms are daily, symptoms are severe per patient   Mental Health Symptoms Depression:   Worthlessness   Duration of Depressive symptoms:  Greater than two weeks   Mania:   None   Anxiety:    Worrying; Tension; Sleep; Restlessness; Irritability; Fatigue; Difficulty concentrating   Psychosis:   None   Duration of Psychotic symptoms: No data recorded  Trauma:   None   Obsessions:   None   Compulsions:   None   Inattention:   Poor follow-through on tasks; Symptoms before age 61; Does not follow  instructions (not oppositional); Fails to pay attention/makes careless mistakes; Symptoms present in 2 or more settings   Hyperactivity/Impulsivity:   None   Oppositional/Defiant Behaviors:   None   Emotional Irregularity:   None   Other Mood/Personality Symptoms:   None    Mental Status Exam Appearance and self-care  Stature:   Average   Weight:   Average weight   Clothing:   Casual   Grooming:   Normal   Cosmetic use:   None   Posture/gait:   Normal   Motor activity:   Not Remarkable   Sensorium  Attention:   Normal   Concentration:   Anxiety interferes   Orientation:   X5   Recall/memory:   Normal   Affect and Mood  Affect:   Anxious   Mood:   Anxious   Relating  Eye contact:   Normal   Facial expression:   Anxious   Attitude toward examiner:   Cooperative   Thought and Language  Speech flow:  Normal   Thought content:   Appropriate to Mood and Circumstances   Preoccupation:   None   Hallucinations:   None   Organization:  No data recorded  [employer] of Knowledge:   Good   Intelligence:   Average   Abstraction:   Normal   Judgement:   Good   Reality Testing:   Adequate   Insight:   Good   Decision Making:   Paralyzed   Social Functioning  Social Maturity:   Responsible   Social Judgement:   Normal   Stress  Stressors:   Work; Transitions   Coping Ability:   Overwhelmed   Skill Deficits:   Communication; Decision making  Supports:   Family; Church     Religion: Religion/Spirituality Are You A Religious Person?: Yes What is Your Religious Affiliation?: Christian How Might This Affect Treatment?: Support in treatment  Leisure/Recreation: Leisure / Recreation Do You Have Hobbies?: Yes Leisure and Hobbies: spend time with family, watch tv, yardwork  Exercise/Diet: Exercise/Diet Do You Exercise?: No Have You Gained or Lost A Significant Amount of Weight in the Past  Six Months?: Yes-Gained Number of Pounds Gained: 15 Do You Follow a Special Diet?: No Do You Have Any Trouble Sleeping?: Yes Explanation of Sleeping Difficulties: Difficulty with falling asleep, heart palpatations   CCA Employment/Education Employment/Work Situation: Employment / Work Situation Employment Situation: Employed 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 How Long has Patient Been Employed?: 9.5 years Are You Satisfied With Your Job?: No Do You Work More Than One Job?: No Work Stressors: Doesn't feel sercure in his employement currently, changes can be overwhelming Patient's Job has Been Impacted by Current Illness: Yes Describe how Patient's Job has Been Impacted: getting overwhelmed, and everything became a stressor What is the Longest Time Patient has Held a Job?: 9.5 years Where was the Patient Employed at that Time?: Current job Has Patient ever Been in Equities trader?: No  Education: Education Is Patient Currently Attending School?: No Last Grade Completed: 12 Name of High School: [employer] Did Garment/textile technologist From [employer]?: Yes Did Theme park manager?: Yes What Type of College Degree Do you Have?: Bachelors Did Designer, television/film set?:  (Took a few graduate level courses) 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 Did You Have Any Special Interests In School?: Science, History Did You Have An Individualized Education Program (IIEP): No Did You Have Any Difficulty At School?: No Patient's Education Has Been Impacted by Current Illness: No   CCA Family/Childhood History Family and Relationship History: Family history Marital status: Married Number of Years Married: 3 What types of issues is patient dealing with in the relationship?: None Additional relationship information: None Are you sexually active?: Yes What is your sexual orientation?: Heterosexual Has your sexual activity been affected by drugs, alcohol, medication, or  emotional stress?: medication can impact libido Does patient have children?: Yes How many children?: 1 How is patient's relationship with their children?: 59 week old daughter  Childhood History:  Childhood History By whom was/is the patient raised?: Both parents Additional childhood history information: Both parents in the home. Patient describes childhood as "good." Description of patient's relationship with caregiver when they were a child: Mother: good, Father: good Patient's description of current relationship with people who raised him/her: Mother: good, Father: good How were you disciplined when you got in trouble as a child/adolescent?: grounded, yelled Does patient have siblings?: Yes Number of Siblings: 2 Description of patient's current relationship with siblings: younger brother, older sister: close with younger brother, good with older sister Did patient suffer any verbal/emotional/physical/sexual abuse as a child?: No Did patient suffer from severe childhood neglect?: No Has patient ever been sexually abused/assaulted/raped as an adolescent or adult?: No Was the patient ever a victim of a crime or a disaster?: No Witnessed domestic violence?: No Has patient been affected by domestic violence as an adult?: No  Child/Adolescent Assessment:     CCA Substance Use Alcohol/Drug Use: Alcohol / Drug Use Pain Medications: See patient MAR Prescriptions: See patient MAR Over the Counter: See patient MAR History of alcohol / drug use?: No history of alcohol / drug abuse  ASAM's:  Six Dimensions of Multidimensional Assessment  Dimension 1:  Acute Intoxication and/or Withdrawal Potential:   Dimension 1:  Description of individual's past and current experiences of substance use and withdrawal: None  Dimension 2:  Biomedical Conditions and Complications:   Dimension 2:  Description of patient's biomedical conditions and  complications: None   Dimension 3:  Emotional, Behavioral, or Cognitive Conditions and Complications:  Dimension 3:  Description of emotional, behavioral, or cognitive conditions and complications: None  Dimension 4:  Readiness to Change:  Dimension 4:  Description of Readiness to Change criteria: None  Dimension 5:  Relapse, Continued use, or Continued Problem Potential:  Dimension 5:  Relapse, continued use, or continued problem potential critiera description: None  Dimension 6:  Recovery/Living Environment:  Dimension 6:  Recovery/Iiving environment criteria description: None  ASAM Severity Score: ASAM's Severity Rating Score: 0  ASAM Recommended Level of Treatment:     Substance use Disorder (SUD)    Recommendations for Services/Supports/Treatments: Recommendations for Services/Supports/Treatments Recommendations For Services/Supports/Treatments: Individual Therapy, Medication Management  DSM5 Diagnoses: Patient Active Problem List   Diagnosis Date Noted   Anxiety 05/02/2022   Insomnia 05/02/2022   Healthcare maintenance 10/21/2019   Chronic right shoulder pain 10/21/2019   Palpitations 05/17/2015    Patient Centered Plan: Patient is on the following Treatment Plan(s):  Anxiety   Referrals to Alternative Service(s): Referred to Alternative Service(s):   Place:   Date:   Time:    Referred to Alternative Service(s):   Place:   Date:   Time:    Referred to Alternative Service(s):   Place:   Date:   Time:    Referred to Alternative Service(s):   Place:   Date:   Time:      Collaboration of Care: Psychiatrist AEB Dr. Thresa Ross  Patient/Guardian was advised Release of Information must be obtained prior to any record release in order to collaborate their care with an outside provider. Patient/Guardian was advised if they have not already done so to contact the registration department to sign all necessary forms in order for [place] to release information regarding their care.   Consent:  Patient/Guardian gives verbal consent for treatment and assignment of benefits for services provided during this visit. Patient/Guardian expressed understanding and agreed to proceed.   Bynum Bellows, LCSW

## 2023-04-13 ENCOUNTER — Telehealth (HOSPITAL_COMMUNITY): Payer: Self-pay | Admitting: *Deleted

## 2023-04-13 NOTE — Telephone Encounter (Signed)
Patient request refill  diazepam (VALIUM) 10 MG tablet [[id number]]   Order Details  Dose: 10 mg Route: Oral Frequency: Daily PRN for anxiety  Dispense Quantity: 15 tablet Refills: 2   Indications of Use: Anxiety       Sig: Take 1 tablet (10 mg total) by mouth daily as needed for anxiety.       Last appt   04/01/23 Next appt 04/27/23

## 2023-04-14 MED ORDER — DIAZEPAM 10 MG PO TABS: 10.0000 mg | ORAL_TABLET | Freq: Every day | ORAL | 1 refills | Status: DC | PRN

## 2023-04-14 NOTE — Addendum Note (Signed)
Addended by: Thresa Ross on: 04/14/2023 08:39 AM   Modules accepted: Orders

## 2023-04-16 ENCOUNTER — Other Ambulatory Visit (HOSPITAL_COMMUNITY): Payer: Self-pay | Admitting: Psychiatry

## 2023-04-16 ENCOUNTER — Telehealth (HOSPITAL_COMMUNITY): Payer: Self-pay | Admitting: *Deleted

## 2023-04-16 DIAGNOSIS — F900 Attention-deficit hyperactivity disorder, predominantly inattentive type: Secondary | ICD-10-CM

## 2023-04-16 MED ORDER — AMPHETAMINE-DEXTROAMPHETAMINE 10 MG PO TABS: 10.0000 mg | ORAL_TABLET | Freq: Every day | ORAL | 0 refills | Status: DC

## 2023-04-16 NOTE — Telephone Encounter (Signed)
Patient called requested to change Rx location to   [id number] - [place], [place] - [address]   amphetamine-dextroamphetamine (ADDERALL) 10 MG tablet   [facility] doesn't have on their profile unable to call Rx to request transfer

## 2023-04-16 NOTE — Telephone Encounter (Signed)
Patient refill request--  Pharmacy  [facility] - [place], [place] - [address], [place] [address]   amphetamine-dextroamphetamine (ADDERALL) 10 MG tablet [[id number]]   Order Details Dose: 10 mg Route: Oral Frequency: Daily  Dispense Quantity: 7 tablet Refills: 0        Sig: Take 1 tablet (10 mg total) by mouth daily.   Last appt  04/01/23 Next appt  04/27/23

## 2023-04-16 NOTE — Telephone Encounter (Signed)
PATIENT HAS CALLED BACK NOW REQUESTING  amphetamine-dextroamphetamine (ADDERALL) 10 MG tablet  GO TO THE DRUG STORE THAT HAS MEDICATION IN STOCK -- [facility] - [place], [place] - [address]

## 2023-04-16 NOTE — Addendum Note (Signed)
Addended by: Thresa Ross on: 04/16/2023 09:46 AM   Modules accepted: Orders

## 2023-04-27 ENCOUNTER — Telehealth (HOSPITAL_COMMUNITY): Payer: 59 | Admitting: Psychiatry

## 2023-04-30 ENCOUNTER — Encounter (HOSPITAL_COMMUNITY): Payer: Self-pay | Admitting: Psychiatry

## 2023-04-30 ENCOUNTER — Telehealth (INDEPENDENT_AMBULATORY_CARE_PROVIDER_SITE_OTHER): Payer: 59 | Admitting: Psychiatry

## 2023-04-30 DIAGNOSIS — F4010 Social phobia, unspecified: Secondary | ICD-10-CM

## 2023-04-30 DIAGNOSIS — F410 Panic disorder [episodic paroxysmal anxiety] without agoraphobia: Secondary | ICD-10-CM

## 2023-04-30 DIAGNOSIS — F900 Attention-deficit hyperactivity disorder, predominantly inattentive type: Secondary | ICD-10-CM | POA: Diagnosis not present

## 2023-04-30 MED ORDER — PAROXETINE HCL 30 MG PO TABS: 30.0000 mg | ORAL_TABLET | Freq: Every day | ORAL | 0 refills | Status: DC

## 2023-04-30 NOTE — Progress Notes (Signed)
[facility] Follow up visit  Patient Identification: Kyle Ward MRN:  [id number] Date of Evaluation:  04/30/2023 Referral Source: Dr. Toni Arthurs psychiatrist Chief Complaint:  anxiety, performance anxiety, inattention No chief complaint on file. inattention Visit Diagnosis:    ICD-10-CM   1. Panic disorder  F41.0     2. Social anxiety disorder  F40.10     3. Attention deficit hyperactivity disorder (ADHD), predominantly inattentive type  F90.0       Virtual Visit via Video Note  I connected with Kyle Ward on 04/30/23 at  8:30 AM EDT by a video enabled telemedicine application and verified that I am speaking with the correct person using two identifiers.  Location: Patient: home Provider: office   I discussed the limitations of evaluation and management by telemedicine and the availability of in person appointments. The patient expressed understanding and agreed to proceed.      I discussed the assessment and treatment plan with the patient. The patient was provided an opportunity to ask questions and all were answered. The patient agreed with the plan and demonstrated an understanding of the instructions.   The patient was advised to call back or seek an in-person evaluation if the symptoms worsen or if the condition fails to improve as anticipated.  I provided 20  minutes of non-face-to-face time during this encounter.      History of Present Illness: Patient is a 32 years old currently married Caucasian male initially referred by psychiatrist Dr. 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  Married 2022  Is off work due to palpitations, panic attacks , working with cardiology with holter monitoring Last visit increased paxil has helped some and less palpitations. We have cut down adderall to 10mg  and he understands its effect as well Now scheduled for therapy 4 sessions, still does not feel like going  back to work as it may decompensate  Looking for other options Need extention for 4 -5 weeks to be off work till finsihes therpy and work with cardiology Has taken valium but less often Had baby 2 months ago   Aggravating factors;performance anxiety, job stress Modifying factors; dog, his family, marriage  Duration more than 6 years Severity somewhat anxious  Past Psychiatric History: anxiety, adhd  Previous Psychotropic Medications: Yes   Substance Abuse History in the last 12 months:  Yes.    Consequences of Substance Abuse: Discussed effect of alcohol use to depression, anxiety and medications, judjement  Past Medical History:  Past Medical History:  Diagnosis Date   Anxiety     Past Surgical History:  Procedure Laterality Date   ELBOW SURGERY      Family Psychiatric History: Grand PA: Bipolar  Family History:  Family History  Problem Relation Age of Onset   Liver disease Father    Breast cancer Mother    Diabetes Mother    Stroke Maternal Grandmother    Bipolar disorder Paternal Grandfather     Social History:   Social History   Socioeconomic History   Marital status: Married    Spouse name: Not on file   Number of children: Not on file   Years of education: Not on file   Highest education level: Not on file  Occupational History   Not on file  Tobacco Use   Smoking status: Former   Smokeless tobacco: Never   Tobacco comments:    Smoked during college.  Substance and Sexual Activity   Alcohol use: Yes    Alcohol/week: 0.0 standard drinks of alcohol    Comment: 2 servings of hard cider weekly   Drug use: No   Sexual activity: Not on file  Other Topics Concern   Not on file  Social History Narrative   Engaged to be married.     Social Determinants of Health   Financial Resource Strain: Low Risk  (03/19/2023)   Received from [facility]   Overall Financial Resource Strain (CARDIA)    Difficulty of Paying Living Expenses:  Not hard at all  Food Insecurity: Low Risk  (03/24/2023)   [facility]   Food vital sign    Within the past 12 months, you worried that your food would run out before you got money to buy more: Never true    Within the past 12 months, the food you bought just didn't last and you didn't have money to get more. : Never true  Transportation Needs: No Transportation Needs (03/24/2023)   [facility]   Transportation    In the past 12 months, has lack of reliable transportation kept you from medical appointments, meetings, work or from getting things needed for daily living? : No  Physical Activity: Not on file  Stress: Not on file  Social Connections: Unknown (03/11/2023)   [facility]   Social Network    Social Network: Not on file    Additional Social History: grew up with parents, good no abuse, graduated college  Allergies:  No Known Allergies  Metabolic Disorder Labs: No results found for: "HGBA1C", "MPG" No results found for: "PROLACTIN" No results found for: "CHOL", "TRIG", "HDL", "CHOLHDL", "VLDL", "LDLCALC" Lab Results  Component Value Date   TSH 1.048 05/17/2015    Therapeutic Level Labs: No results found for: "LITHIUM" No results found for: "CBMZ" No results found for: "VALPROATE"  Current Medications: Current Outpatient Medications  Medication Sig Dispense Refill   amoxicillin-clavulanate (AUGMENTIN) 875-125 MG tablet Take 1 tablet by mouth 2 (two) times daily. 20 tablet 0   amphetamine-dextroamphetamine (ADDERALL) 10 MG tablet TAKE ONE TABLET BY MOUTH DAILY 30 tablet 0   amphetamine-dextroamphetamine (ADDERALL) 12.5 MG tablet Take 1 tablet by mouth in the morning. 30 tablet 0   diazepam (VALIUM) 10 MG tablet Take 1 tablet (10 mg total) by mouth daily as needed for anxiety. 15 tablet 1   meloxicam (MOBIC) 15 MG tablet Take 0.5-1 tablets (7.5-15 mg total) by mouth daily as needed.  (Patient not taking: Reported on 05/24/2020) 30 tablet 2   PARoxetine (PAXIL) 30 MG tablet Take 1 tablet (30 mg total) by mouth daily. 30 tablet 0   No current facility-administered medications for this visit.     Psychiatric Specialty Exam: Review of Systems  Cardiovascular:  Negative for chest pain.  Skin:  Negative for rash.  Neurological:  Negative for tremors.  Psychiatric/Behavioral:  Negative for agitation. The patient is nervous/anxious.     There were no vitals taken for this visit.There is no height or weight on file to calculate BMI.  General Appearance: Fairly Groomed  Eye Contact:  Good  Speech:  Slow  Volume:  Decreased  Mood: anxiuos somewhat  Affect:  Congruent  Thought Process:  Goal Directed  Orientation:  Full (Time, Place, and Person)  Thought Content:  Rumination  Suicidal Thoughts:  No  Homicidal Thoughts:  No  Memory:  Immediate;  Fair  Judgement:  Fair  Insight:  Fair  Psychomotor Activity:  Normal  Concentration:  Concentration: Fair  Recall:  [employer] of Knowledge:Fair  Language: Good  Akathisia:  No  Handed:    AIMS (if indicated):  not done  Assets:  Desire for Improvement Financial Resources/Insurance Housing  ADL's:  Intact  Cognition: WNL  Sleep:  Fair   Screenings: Insurance account manager from 04/03/2023 in [facility] Office Visit from 05/02/2022 in [facility] at [facility] Office Visit from 10/21/2019 in [facility] Total Score 2 0 0  PHQ-9 Total Score 7 -- --      Flowsheet Row Counselor from 04/03/2023 in [facility] at [facility] Video Visit from 07/30/2022 in [facility] at [facility] Video Visit from 06/04/2022 in [facility] at [facility]  C-SSRS RISK  CATEGORY No Risk No Risk No Risk       Assessment and Plan: as follows  Prior documentation reviewed   Panic disorder; has had panic attacks, some better with paxil increase , also on valium, now doing holter monitoring, remain off work for next 4 -5 weeks, or till September 18 next visit   Add therapy to work on panic attacks Social anxiety disorder; gets anxiious, work in therapy and contineu paxil  ADHD; manageable but has to take adderall, understand it can cause palpitations as well, we have reduced dose  Fu 5 weeks or earlier if needed  Collaboration of Care: Other read Dr. Toni Arthurs referral and reviewed notes  Patient/Guardian was advised Release of Information must be obtained prior to any record release in order to collaborate their care with an outside provider. Patient/Guardian was advised if they have not already done so to contact the registration department to sign all necessary forms in order for [place] to release information regarding their care.   Consent: Patient/Guardian gives verbal consent for treatment and assignment of benefits for services provided during this visit. Patient/Guardian expressed understanding and agreed to proceed.   Thresa Ross, MD 7/25/20248:52 AM

## 2023-05-08 ENCOUNTER — Telehealth (HOSPITAL_COMMUNITY): Payer: Self-pay | Admitting: Psychiatry

## 2023-05-08 ENCOUNTER — Ambulatory Visit (INDEPENDENT_AMBULATORY_CARE_PROVIDER_SITE_OTHER): Payer: 59 | Admitting: Licensed Clinical Social Worker

## 2023-05-08 DIAGNOSIS — F410 Panic disorder [episodic paroxysmal anxiety] without agoraphobia: Secondary | ICD-10-CM | POA: Diagnosis not present

## 2023-05-08 NOTE — Progress Notes (Unsigned)
   THERAPIST PROGRESS NOTE  Session Time: 11:10 am-12:00 pm  Type of Therapy: Individual Therapy  Purpose of session: Kyle Ward will manage anxiety as evidenced by increasing physical wellness, identify and challenging anxious thoughts, regulate physical and cognitive reactions, increase zest for life, and improve sleep for 5 out of 7 days for 60 days.  ProgressTowards Goals: Initial  Interventions: Therapist utilized CBT and solution focused brief therapy to address anxiety. Therapist provided support and empathy to patient during session. Therapist administered the PHQ9 and GAD7 to patient. Therapist provided psychoeducation to patient and worked with patient to relate CBT to his anxiety.   Effectiveness: Patient was oriented x4 (person, place, situation, and time). Patient was casually dressed, and appropriately groomed. Patient was alert, engaged, pleasant, and cooperative. Patient completed the PHQ9 with a score of 9 indicating mild depressive symptoms present. Patient completed the GAD7 with a score of 10 indicating moderate anxious symptoms present. Patient noted that he has felt down about his increased weight. Patient no longer has palpitations in his head but still feels them in his chest. Patient noted that he has been overeating as well. Patient understood CBT and was able to apply those to his concerns over medication, disappointing family, food, etc. Patient is going to track his thoughts, feelings, and actions between session.   Patient engaged in session. Patient responded well to interventions. Patient continues to meet criteria for Panic Disorder. Patient will continue in outpatient therapy due to being the least restrictive service to meet her needs. Patient made minimal progress on his goals.    Suicidal/Homicidal: Nowithout intent/plan  Plan: Return again in 2-4 weeks.  Diagnosis: Panic disorder  Collaboration of Care: Psychiatrist AEB Dr. Thresa Ross  Patient/Guardian  was advised Release of Information must be obtained prior to any record release in order to collaborate their care with an outside provider. Patient/Guardian was advised if they have not already done so to contact the registration department to sign all necessary forms in order for [place] to release information regarding their care.   Consent: Patient/Guardian gives verbal consent for treatment and assignment of benefits for services provided during this visit. Patient/Guardian expressed understanding and agreed to proceed.   Bynum Bellows, LCSW 05/08/2023

## 2023-05-08 NOTE — Telephone Encounter (Signed)
Patient in clinic for therapy appointment an requested early refill of  amphetamine-dextroamphetamine (ADDERALL) 10 MG tablet stating "some got lost in the wash and I've been taking an extra one sometimes to stay awake in the afternoons."   [id number] [place], [place] - [address] AT [facility] OF [address] & [address] Phone: [phone]  Fax: [phone]     Last ordered: 04/17/2023 - 30 tablets  Last visit: 04/30/2023  Next visit: 06/24/2023

## 2023-05-11 ENCOUNTER — Telehealth (HOSPITAL_COMMUNITY): Payer: Self-pay | Admitting: *Deleted

## 2023-05-11 MED ORDER — DIAZEPAM 10 MG PO TABS: 10.0000 mg | ORAL_TABLET | Freq: Every day | ORAL | 0 refills | Status: DC | PRN

## 2023-05-11 NOTE — Telephone Encounter (Signed)
PATIENT REFILL REQUEST--  [id number] - [place], [place] - [address] AT [address] OF [address]   amphetamine-dextroamphetamine (ADDERALL) 10 MG tablet   diazepam (VALIUM) 10 MG tablet   LAST APPT  04/30/23 NEXT APPT  06/24/23

## 2023-05-11 NOTE — Addendum Note (Signed)
Addended by: Thresa Ross on: 05/11/2023 03:48 PM   Modules accepted: Orders

## 2023-05-15 ENCOUNTER — Ambulatory Visit (INDEPENDENT_AMBULATORY_CARE_PROVIDER_SITE_OTHER): Payer: 59 | Admitting: Licensed Clinical Social Worker

## 2023-05-15 ENCOUNTER — Telehealth (HOSPITAL_COMMUNITY): Payer: Self-pay | Admitting: Psychiatry

## 2023-05-15 DIAGNOSIS — F410 Panic disorder [episodic paroxysmal anxiety] without agoraphobia: Secondary | ICD-10-CM

## 2023-05-15 DIAGNOSIS — F900 Attention-deficit hyperactivity disorder, predominantly inattentive type: Secondary | ICD-10-CM

## 2023-05-15 MED ORDER — AMPHETAMINE-DEXTROAMPHETAMINE 10 MG PO TABS
10.0000 mg | ORAL_TABLET | Freq: Every day | ORAL | 0 refills | Status: DC
Start: 2023-05-15 — End: 2023-05-27

## 2023-05-15 NOTE — Progress Notes (Unsigned)
   THERAPIST PROGRESS NOTE  Session Time: 11:00 am-12:00 pm  Type of Therapy: Individual Therapy  Purpose of session: Kyle Ward will manage anxiety as evidenced by increasing physical wellness, identify and challenging anxious thoughts, regulate physical and cognitive reactions, increase zest for life, and improve sleep for 5 out of 7 days for 60 days.  ProgressTowards Goals: Initial Interventions: Therapist utilized CBT and solution focused brief therapy to address anxiety. Therapist provided support and empathy to patient during session. Therapist administered the PHQ9 and GAD7 to patient. Therapist followed up on patient's homework to patient attention to thoughts, feelings, and actions/CBT triangle. Therapist worked with patient to identify his values and work on Physicist, medical. Therapist provided psychoeducation on the physiological sigh.    Effectiveness: Patient was oriented x4 (person, place, situation, and time). Patient was casually dressed, and appropriately groomed. Patient was alert, engaged, pleasant, and cooperative. Patient completed the PHQ9 with a score of 18 indicating severe depressive symptoms present. Patient completed the GAD7 with a score of 14 indicating moderate anxious symptoms present. Patient has been paying attention to his thoughts, feelings, and reactions. He was able to track his food for 5 out of 7 days. He feels like he got distracted when his father and brother came into town. Patient also noted that he got a call from a former boss/supervisor. He was frustrated to hear from him because he was part of the reason he got a bad review. Patient recognized that he was annoyed due to feeling thrown under the bus by him, and he was acting like everything was fine. Patient was able to have a calm conversation but gave short answers to express his annoyance. He knows that he will see this person again on the job and plans to only keep it professional with him and not get personal.  Patient was also able to identify his values such as: family,  peace, work, physical health/wellness, current lifestyle, etc. Patient noted that anxious thinking, feeling lazy, bad review at work, environment at work, etc stop him from fully living his values. When he experiences these feelings, memories, or sensations the he will sleep, eat, avoid, etc. Patient feels like if the accepts, takes walks, and medication compliance will get him closer to his values. Patient understood the physiological sigh to help quickly reduce anxiety and he will practice.    Patient engaged in session. Patient responded well to interventions. Patient continues to meet criteria for Panic Disorder. Patient will continue in outpatient therapy due to being the least restrictive service to meet her needs. Patient made minimal progress on his goals.    Suicidal/Homicidal: Nowithout intent/plan  Plan: Return again in 2-4 weeks.  Diagnosis: Panic disorder  Attention deficit hyperactivity disorder (ADHD), predominantly inattentive type  Collaboration of Care: Psychiatrist AEB Dr. Thresa Ross  Patient/Guardian was advised Release of Information must be obtained prior to any record release in order to collaborate their care with an outside provider. Patient/Guardian was advised if they have not already done so to contact the registration department to sign all necessary forms in order for [place] to release information regarding their care.   Consent: Patient/Guardian gives verbal consent for treatment and assignment of benefits for services provided during this visit. Patient/Guardian expressed understanding and agreed to proceed.   Bynum Bellows, LCSW 05/15/2023

## 2023-05-15 NOTE — Telephone Encounter (Signed)
I have sent Adderall to Kyle Ward form [facility].  Will route this message to primary psychiatrist to address once back in office.

## 2023-05-15 NOTE — Telephone Encounter (Signed)
Per pt- needs refill on adderall 10mg  Sent to [facility]   Next Visit - 9/18 Last Visit - 7/25

## 2023-05-22 ENCOUNTER — Ambulatory Visit (INDEPENDENT_AMBULATORY_CARE_PROVIDER_SITE_OTHER): Payer: 59 | Admitting: Licensed Clinical Social Worker

## 2023-05-22 DIAGNOSIS — F410 Panic disorder [episodic paroxysmal anxiety] without agoraphobia: Secondary | ICD-10-CM

## 2023-05-22 DIAGNOSIS — F900 Attention-deficit hyperactivity disorder, predominantly inattentive type: Secondary | ICD-10-CM

## 2023-05-23 NOTE — Progress Notes (Signed)
   THERAPIST PROGRESS NOTE  Session Time: 11:00 am-12:00 pm  Type of Therapy: Individual Therapy  Purpose of session: Kyle Ward will manage anxiety as evidenced by increasing physical wellness, identify and challenging anxious thoughts, regulate physical and cognitive reactions, increase zest for life, and improve sleep for 5 out of 7 days for 60 days.  ProgressTowards Goals: Progressing  Interventions: Therapist utilized CBT and solution focused brief therapy to address anxiety. Therapist provided support and empathy to patient during session. Therapist administered the PHQ9 and GAD7 to patient. Therapist processed patient's to identify triggers for anxiety. Therapist worked with patient on cognitive distortions, and cognitive defusion.    Effectiveness: Patient was oriented x4 (person, place, situation, and time). Patient was casually dressed, and appropriately groomed. Patient was alert, engaged, pleasant, and cooperative. Patient completed the PHQ9 with a score of 10 indicating moderate depressive symptoms present. Patient completed the GAD7 with a score of 8 indicating mild anxious symptoms present.  Patient noted that he has been feeling a little better. Patient is returning to work soon and feels like there is a target on his back. Patient feels like his work will be looking for reasons to get rid of him. Patient has also had an interview with another company but it is a long commute. Patient is going to continue to work on apply for jobs inside and outside of the company. Patient understood cognitive distortions and was able to identify with several including emotional reasoning, generalization, and shoulds. Patient understood cognitive defusion including: recognize that he is having distressing thoughts, name his thoughts, recognize the impact of the thoughts, put space between him and the thoughts, and identify the meaning of the thoughts rather than the literal meaning of the thoughts. Patient is  going to pay attention to his thoughts and work on defusion of the thoughts.    Patient engaged in session. Patient responded well to interventions. Patient continues to meet criteria for Panic Disorder. Patient will continue in outpatient therapy due to being the least restrictive service to meet her needs. Patient made moderate progress on his goals.    Suicidal/Homicidal: Nowithout intent/plan  Plan: Return again in 2-4 weeks.  Diagnosis: Panic disorder  Attention deficit hyperactivity disorder (ADHD), predominantly inattentive type  Collaboration of Care: Psychiatrist AEB Dr. Thresa Ross  Patient/Guardian was advised Release of Information must be obtained prior to any record release in order to collaborate their care with an outside provider. Patient/Guardian was advised if they have not already done so to contact the registration department to sign all necessary forms in order for [place] to release information regarding their care.   Consent: Patient/Guardian gives verbal consent for treatment and assignment of benefits for services provided during this visit. Patient/Guardian expressed understanding and agreed to proceed.   Bynum Bellows, LCSW 05/23/2023

## 2023-05-27 ENCOUNTER — Telehealth (HOSPITAL_COMMUNITY): Payer: Self-pay | Admitting: *Deleted

## 2023-05-27 DIAGNOSIS — F900 Attention-deficit hyperactivity disorder, predominantly inattentive type: Secondary | ICD-10-CM

## 2023-05-27 MED ORDER — DIAZEPAM 10 MG PO TABS: 10.0000 mg | ORAL_TABLET | Freq: Every day | ORAL | 0 refills | Status: DC | PRN

## 2023-05-27 MED ORDER — AMPHETAMINE-DEXTROAMPHETAMINE 10 MG PO TABS
10.0000 mg | ORAL_TABLET | Freq: Every day | ORAL | 0 refills | Status: DC
Start: 2023-05-27 — End: 2023-06-28

## 2023-05-27 NOTE — Telephone Encounter (Signed)
Patient Refill Request-- [facility] [place], [place] - [address]    mphetamine-dextroamphetamine (ADDERALL) 10 MG tablet             && diazepam (VALIUM) 10 MG tablet   NEXT APPT -- 06-25-23 LAST APPT -- 04-30-23

## 2023-05-27 NOTE — Addendum Note (Signed)
Addended by: Thresa Ross on: 05/27/2023 01:56 PM   Modules accepted: Orders

## 2023-05-29 ENCOUNTER — Ambulatory Visit (HOSPITAL_COMMUNITY): Payer: 59 | Admitting: Licensed Clinical Social Worker

## 2023-05-29 DIAGNOSIS — F410 Panic disorder [episodic paroxysmal anxiety] without agoraphobia: Secondary | ICD-10-CM | POA: Diagnosis not present

## 2023-05-29 NOTE — Progress Notes (Unsigned)
   THERAPIST PROGRESS NOTE  Session Time: 11:00 am-11:45 am  Type of Therapy: Individual Therapy  Purpose of session: Kyle Ward will manage anxiety as evidenced by increasing physical wellness, identify and challenging anxious thoughts, regulate physical and cognitive reactions, increase zest for life, and improve sleep for 5 out of 7 days for 60 days.  ProgressTowards Goals: Progressing  Interventions: Therapist utilized CBT and solution focused brief therapy to address anxiety. Therapist provided support and empathy to patient during session. Therapist administered the PHQ9 and GAD7 to patient. Therapist processed patient's feelings and work with patient on anxious thoughts about his current job.   Effectiveness: Patient was oriented x4 (person, place, situation, and time). Patient was casually dressed, and appropriately groomed. Patient was alert, engaged, pleasant, and cooperative. Patient completed the PHQ9 with a score of 6 indicating mild depressive symptoms present. Patient completed the GAD7 with a score of 9 indicating mild anxious symptoms present. Patient was offered a new job and he has accepted. He will start with this job in Sept. He is going to give his notice when he returns to work in mid Sept. Patient had almost considered not giving a 2 weeks notice but his wife encouraged him to "kill them with kindness" and that is what he is going to do. Patient also feels like he doesn't want to have a bad reputation in the field. Patient is feeling much better. He shared an experience with a previous fiance who led him on and told him a bunch of lies such as: she had sickle cell, her father had a tomb, and that she was single. It turns out she was married, she wasn't sick nor her father, and she had "catfished" him with elaborate stories. He was devastated but when he confronted her chose to be kind rather than go off on her.     Patient engaged in session. Patient responded well to interventions.  Patient continues to meet criteria for Panic Disorder. Patient will continue in outpatient therapy due to being the least restrictive service to meet her needs. Patient made moderate progress on his goals.   Suicidal/Homicidal: Nowithout intent/plan  Plan: Return again in 2-4 weeks.  Diagnosis: Panic disorder  Collaboration of Care: Psychiatrist AEB Dr. Thresa Ross  Patient/Guardian was advised Release of Information must be obtained prior to any record release in order to collaborate their care with an outside provider. Patient/Guardian was advised if they have not already done so to contact the registration department to sign all necessary forms in order for [place] to release information regarding their care.   Consent: Patient/Guardian gives verbal consent for treatment and assignment of benefits for services provided during this visit. Patient/Guardian expressed understanding and agreed to proceed.   Bynum Bellows, LCSW 05/29/2023

## 2023-06-22 ENCOUNTER — Ambulatory Visit (HOSPITAL_COMMUNITY): Payer: 59 | Admitting: Licensed Clinical Social Worker

## 2023-06-24 ENCOUNTER — Telehealth (INDEPENDENT_AMBULATORY_CARE_PROVIDER_SITE_OTHER): Payer: 59 | Admitting: Psychiatry

## 2023-06-24 ENCOUNTER — Encounter (HOSPITAL_COMMUNITY): Payer: Self-pay | Admitting: Psychiatry

## 2023-06-24 DIAGNOSIS — F418 Other specified anxiety disorders: Secondary | ICD-10-CM

## 2023-06-24 DIAGNOSIS — F900 Attention-deficit hyperactivity disorder, predominantly inattentive type: Secondary | ICD-10-CM

## 2023-06-24 DIAGNOSIS — F4010 Social phobia, unspecified: Secondary | ICD-10-CM

## 2023-06-24 DIAGNOSIS — F410 Panic disorder [episodic paroxysmal anxiety] without agoraphobia: Secondary | ICD-10-CM

## 2023-06-24 NOTE — Progress Notes (Signed)
[facility] Follow up visit  Patient Identification: Kyle Ward MRN:  [id number] Date of Evaluation:  06/24/2023 Referral Source: Dr. Toni Arthurs psychiatrist Chief Complaint:  anxiety, performance anxiety, inattention No chief complaint on file. inattention Visit Diagnosis:    ICD-10-CM   1. Panic disorder  F41.0     2. Attention deficit hyperactivity disorder (ADHD), predominantly inattentive type  F90.0     3. Social anxiety disorder  F40.10     4. Situational anxiety  F41.8      Virtual Visit via Video Note  I connected with Kyle Ward on 06/24/23 at 10:00 AM EDT by a video enabled telemedicine application and verified that I am speaking with the correct person using two identifiers.  Location: Patient: parked car Provider: home office   I discussed the limitations of evaluation and management by telemedicine and the availability of in person appointments. The patient expressed understanding and agreed to proceed.     I discussed the assessment and treatment plan with the patient. The patient was provided an opportunity to ask questions and all were answered. The patient agreed with the plan and demonstrated an understanding of the instructions.   The patient was advised to call back or seek an in-person evaluation if the symptoms worsen or if the condition fails to improve as anticipated.  I provided 15 minutes of non-face-to-face time during this encounter.      History of Present Illness: Patient is a 32 years old currently married Caucasian male initially referred by psychiatrist Dr. 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  Married 2022  Has been off for panic attacks, rejoined 2 weeks ago and now resigned, has got a new job starting and looking forward. Much content has to commute but its ok Was stress releated to past job   Doing fair with meds paxil, adderall  Has a baby 4months     Aggravating factors;performance anxiety, job stress but resolving Modifying factors; dog, his family, marriage  Duration more than 6 years Severity improving since let go of job  Past Psychiatric History: anxiety, adhd  Previous Psychotropic Medications: Yes   Substance Abuse History in the last 12 months:  Yes.    Consequences of Substance Abuse: Discussed effect of alcohol use to depression, anxiety and medications, judjement  Past Medical History:  Past Medical History:  Diagnosis Date   Anxiety     Past Surgical History:  Procedure Laterality Date   ELBOW SURGERY      Family Psychiatric History: Grand PA: Bipolar  Family History:  Family History  Problem Relation Age of Onset   Liver disease Father    Breast cancer Mother    Diabetes Mother    Stroke Maternal Grandmother    Bipolar disorder Paternal Grandfather     Social History:   Social History   Socioeconomic History   Marital status: Married    Spouse name: Not on file   Number of children: Not on file   Years of education: Not on file   Highest education level: Not on file  Occupational History   Not on file  Tobacco Use   Smoking status: Former   Smokeless tobacco: Never   Tobacco comments:    Smoked during college.    Substance and Sexual Activity   Alcohol use: Yes    Alcohol/week: 0.0 standard drinks of alcohol    Comment: 2 servings of hard cider  weekly   Drug use: No   Sexual activity: Not on file  Other Topics Concern   Not on file  Social History Narrative   Engaged to be married.     Social Determinants of Health   Financial Resource Strain: Low Risk  (03/19/2023)   [facility]   Overall Financial Resource Strain (CARDIA)    Difficulty of Paying Living Expenses: Not hard at all  Food Insecurity: Low Risk  (03/24/2023)   [facility]   Hunger Vital Sign    Worried About Running Out of Food in the Last Year: Never  true    Ran Out of Food in the Last Year: Never true  Transportation Needs: No Transportation Needs (03/24/2023)   [facility]   Transportation    In the past 12 months, has lack of reliable transportation kept you from medical appointments, meetings, work or from getting things needed for daily living? : No  Physical Activity: Not on file  Stress: Not on file  Social Connections: Unknown (03/11/2023)   [facility]   Social Network    Social Network: Not on file    Additional Social History: grew up with parents, good no abuse, graduated college  Allergies:  No Known Allergies  Metabolic Disorder Labs: No results found for: "HGBA1C", "MPG" No results found for: "PROLACTIN" No results found for: "CHOL", "TRIG", "HDL", "CHOLHDL", "VLDL", "LDLCALC" Lab Results  Component Value Date   TSH 1.048 05/17/2015    Therapeutic Level Labs: No results found for: "LITHIUM" No results found for: "CBMZ" No results found for: "VALPROATE"  Current Medications: Current Outpatient Medications  Medication Sig Dispense Refill   amoxicillin-clavulanate (AUGMENTIN) 875-125 MG tablet Take 1 tablet by mouth 2 (two) times daily. 20 tablet 0   amphetamine-dextroamphetamine (ADDERALL) 10 MG tablet Take 1 tablet (10 mg total) by mouth daily. 30 tablet 0   diazepam (VALIUM) 10 MG tablet Take 1 tablet (10 mg total) by mouth daily as needed for anxiety. 30 tablet 0   meloxicam (MOBIC) 15 MG tablet Take 0.5-1 tablets (7.5-15 mg total) by mouth daily as needed. (Patient not taking: Reported on 05/24/2020) 30 tablet 2   PARoxetine (PAXIL) 30 MG tablet Take 1 tablet (30 mg total) by mouth daily. 30 tablet 0   No current facility-administered medications for this visit.     Psychiatric Specialty Exam: Review of Systems  Cardiovascular:  Negative for chest pain.  Skin:  Negative for rash.  Neurological:  Negative for tremors.   Psychiatric/Behavioral:  Negative for agitation. The patient is nervous/anxious.     There were no vitals taken for this visit.There is no height or weight on file to calculate BMI.  General Appearance: Fairly Groomed  Eye Contact:  Good  Speech:  Slow  Volume:  Decreased  Mood: better  Affect:  Congruent  Thought Process:  Goal Directed  Orientation:  Full (Time, Place, and Person)  Thought Content:  Rumination  Suicidal Thoughts:  No  Homicidal Thoughts:  No  Memory:  Immediate;   Fair  Judgement:  Fair  Insight:  Fair  Psychomotor Activity:  Normal  Concentration:  Concentration: Fair  Recall:  [employer] of Knowledge:Fair  Language: Good  Akathisia:  No  Handed:    AIMS (if indicated):  not done  Assets:  Desire for Improvement Financial Resources/Insurance Housing  ADL's:  Intact  Cognition: WNL  Sleep:  Fair   Screenings: Insurance account manager from 04/03/2023 in [facility] Behavioral Health at [facility] Office Visit from 05/02/2022 in [facility] at [facility] Office Visit from 10/21/2019 in [facility] at [facility] Total Score 2 0 0  PHQ-9 Total Score 7 -- --      Flowsheet Row Counselor from 04/03/2023 in [facility] at [facility] Video Visit from 07/30/2022 in [facility] Outpatient Behavioral Health at [facility] Video Visit from 06/04/2022 in [facility] Outpatient Behavioral Health at [facility]  C-SSRS RISK CATEGORY No Risk No Risk No Risk       Assessment and Plan: as follows  Prior documentation reviewed  Panic disorder; better and resolving since not at that last job, continue paxil  Social anxiety disorder; manageable continue paxil  ADHD; manageable, understand no early refills and take drug holidays, continue adderall and fill when due No headaches or side effects   Fu  65m.   Collaboration of Care: Other read Dr. Toni Arthurs referral and reviewed notes  Patient/Guardian was advised Release of Information must be obtained prior to any record release in order to collaborate their care with an outside provider. Patient/Guardian was advised if they have not already done so to contact the registration department to sign all necessary forms in order for [place] to release information regarding their care.   Consent: Patient/Guardian gives verbal consent for treatment and assignment of benefits for services provided during this visit. Patient/Guardian expressed understanding and agreed to proceed.   Thresa Ross, MD 9/18/202410:08 AM

## 2023-06-27 ENCOUNTER — Other Ambulatory Visit (HOSPITAL_COMMUNITY): Payer: Self-pay | Admitting: Psychiatry

## 2023-06-27 DIAGNOSIS — F900 Attention-deficit hyperactivity disorder, predominantly inattentive type: Secondary | ICD-10-CM

## 2023-06-29 ENCOUNTER — Ambulatory Visit (HOSPITAL_COMMUNITY): Payer: 59 | Admitting: Licensed Clinical Social Worker

## 2023-06-29 ENCOUNTER — Telehealth (HOSPITAL_COMMUNITY): Payer: Self-pay | Admitting: *Deleted

## 2023-06-29 ENCOUNTER — Other Ambulatory Visit (HOSPITAL_COMMUNITY): Payer: Self-pay | Admitting: Psychiatry

## 2023-06-29 DIAGNOSIS — F900 Attention-deficit hyperactivity disorder, predominantly inattentive type: Secondary | ICD-10-CM

## 2023-06-29 MED ORDER — DIAZEPAM 10 MG PO TABS: 10.0000 mg | ORAL_TABLET | Freq: Every day | ORAL | 0 refills | Status: DC | PRN

## 2023-06-29 NOTE — Telephone Encounter (Signed)
Patient Refill Request-  diazepam (VALIUM) 10 MG tablet 30 tablet 0 05/27/2023 --   Sig - Route: Take 1 tablet (10 mg total) by mouth daily as needed for  anxiety. - Oral   Sent to pharmacy as: diazepam (VALIUM) 10 MG tablet   E-Prescribing Status: Receipt confirmed by pharmacy (05/27/2023  1:56 PM EDT)    Pharmacy  [facility] - [place], [place] - [address]    Next Appt   09/23/23 Last Appt   06/24/23

## 2023-06-29 NOTE — Addendum Note (Signed)
Addended by: Thresa Ross on: 06/29/2023 10:44 AM   Modules accepted: Orders

## 2023-06-30 ENCOUNTER — Other Ambulatory Visit (HOSPITAL_COMMUNITY): Payer: Self-pay | Admitting: Psychiatry

## 2023-07-06 ENCOUNTER — Ambulatory Visit (HOSPITAL_COMMUNITY): Payer: 59 | Admitting: Licensed Clinical Social Worker

## 2023-07-27 ENCOUNTER — Telehealth (HOSPITAL_COMMUNITY): Payer: Self-pay | Admitting: Psychiatry

## 2023-07-27 DIAGNOSIS — F900 Attention-deficit hyperactivity disorder, predominantly inattentive type: Secondary | ICD-10-CM

## 2023-07-27 MED ORDER — DIAZEPAM 10 MG PO TABS
10.0000 mg | ORAL_TABLET | Freq: Every day | ORAL | 0 refills | Status: DC | PRN
Start: 2023-07-27 — End: 2023-08-25

## 2023-07-27 MED ORDER — AMPHETAMINE-DEXTROAMPHETAMINE 10 MG PO TABS
10.0000 mg | ORAL_TABLET | Freq: Every day | ORAL | 0 refills | Status: DC
Start: 2023-07-27 — End: 2023-08-24

## 2023-07-27 NOTE — Telephone Encounter (Signed)
Patient calling needs refill on    ADDERALL and VALIUM  [facility] - [place], [place] - [address]     NEXT APPT -- 12/18 LAST APPT -- 9/18

## 2023-08-24 ENCOUNTER — Telehealth (HOSPITAL_COMMUNITY): Payer: Self-pay | Admitting: *Deleted

## 2023-08-24 DIAGNOSIS — F900 Attention-deficit hyperactivity disorder, predominantly inattentive type: Secondary | ICD-10-CM

## 2023-08-24 MED ORDER — AMPHETAMINE-DEXTROAMPHETAMINE 10 MG PO TABS
10.0000 mg | ORAL_TABLET | Freq: Every day | ORAL | 0 refills | Status: DC
Start: 2023-08-24 — End: 2023-09-21

## 2023-08-24 NOTE — Addendum Note (Signed)
Addended by: Thresa Ross on: 08/24/2023 09:50 AM   Modules accepted: Orders

## 2023-08-24 NOTE — Telephone Encounter (Signed)
PATIENT REFILL REQUEST  amphetamine-dextroamphetamine ADDERALL) 10 MG tablet  [facility] [place], [place] - [address]      (Next Appt   09/23/23 Last Appt   06/24/23

## 2023-08-25 ENCOUNTER — Other Ambulatory Visit (HOSPITAL_COMMUNITY): Payer: Self-pay | Admitting: Psychiatry

## 2023-09-21 ENCOUNTER — Telehealth (HOSPITAL_COMMUNITY): Payer: Self-pay | Admitting: *Deleted

## 2023-09-21 DIAGNOSIS — F900 Attention-deficit hyperactivity disorder, predominantly inattentive type: Secondary | ICD-10-CM

## 2023-09-21 MED ORDER — AMPHETAMINE-DEXTROAMPHETAMINE 10 MG PO TABS
10.0000 mg | ORAL_TABLET | Freq: Every day | ORAL | 0 refills | Status: DC
Start: 2023-09-21 — End: 2023-10-16

## 2023-09-21 NOTE — Telephone Encounter (Signed)
Patient Refill Request  amphetamine-dextroamphetamine (ADDERALL) 10 MG tablet   diazepam (VALIUM) 10 MG tablet   [facility] [place],  [place] - [address]       Next Appt   09/23/23 Last Appt   06/24/23

## 2023-09-21 NOTE — Addendum Note (Signed)
Addended by: Thresa Ross on: 09/21/2023 07:28 PM   Modules accepted: Orders

## 2023-09-23 ENCOUNTER — Telehealth (HOSPITAL_COMMUNITY): Payer: BC Managed Care – PPO | Admitting: Psychiatry

## 2023-09-23 ENCOUNTER — Encounter (HOSPITAL_COMMUNITY): Payer: Self-pay | Admitting: Psychiatry

## 2023-09-23 DIAGNOSIS — F418 Other specified anxiety disorders: Secondary | ICD-10-CM | POA: Diagnosis not present

## 2023-09-23 DIAGNOSIS — F410 Panic disorder [episodic paroxysmal anxiety] without agoraphobia: Secondary | ICD-10-CM

## 2023-09-23 DIAGNOSIS — F900 Attention-deficit hyperactivity disorder, predominantly inattentive type: Secondary | ICD-10-CM | POA: Diagnosis not present

## 2023-09-23 DIAGNOSIS — F4010 Social phobia, unspecified: Secondary | ICD-10-CM

## 2023-09-23 NOTE — Progress Notes (Signed)
[facility] Follow up visit  Patient Identification: Kyle Ward MRN:  [id number] Date of Evaluation:  09/23/2023 Referral Source: Dr. Toni Arthurs psychiatrist Chief Complaint:  anxiety, performance anxiety, inattention No chief complaint on file. inattention Visit Diagnosis:    ICD-10-CM   1. Panic disorder  F41.0     2. Social anxiety disorder  F40.10     3. Attention deficit hyperactivity disorder (ADHD), predominantly inattentive type  F90.0     4. Situational anxiety  F41.8      Virtual Visit via Video Note  I connected with Hendricks Canty Petrakis on 09/23/23 at 11:30 AM EST by a video enabled telemedicine application and verified that I am speaking with the correct person using two identifiers.  Location: Patient: parked car work Provider: home office   I discussed the limitations of evaluation and management by telemedicine and the availability of in person appointments. The patient expressed understanding and agreed to proceed.    I discussed the assessment and treatment plan with the patient. The patient was provided an opportunity to ask questions and all were answered. The patient agreed with the plan and demonstrated an understanding of the instructions.   The patient was advised to call back or seek an in-person evaluation if the symptoms worsen or if the condition fails to improve as anticipated.  I provided 20 minutes of non-face-to-face time during this encounter.       History of Present Illness: Patient is a 32 years old currently married Caucasian male initially referred by psychiatrist Dr. 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  Married 2022   Changed his job now and panic attacks are seldom, calmer now anxiety manageable and likes his current job  Takes valium at night prn for sleep and anxiety Adderall during weekdays,  Attention fair and mood is fair  No headaches, chest pain  Wants to  lower paxil we talked of during coming spring   Aggravating factors;performance anxiety, job stress but resolving Modifying factors; dog, his family,  marriage  Duration more than 6 years Severity improved  Past Psychiatric History: anxiety, adhd  Previous Psychotropic Medications: Yes   Substance Abuse History in the last 12 months:  Yes.    Consequences of Substance Abuse: Discussed effect of alcohol use to depression, anxiety and medications, judjement  Past Medical History:  Past Medical History:  Diagnosis Date   Anxiety     Past Surgical History:  Procedure Laterality Date   ELBOW SURGERY      Family Psychiatric History: Grand PA: Bipolar  Family History:  Family History  Problem Relation Age of Onset   Liver disease Father    Breast cancer Mother    Diabetes Mother    Stroke Maternal Grandmother    Bipolar disorder Paternal Grandfather     Social History:   Social History   Socioeconomic History   Marital status: Married    Spouse name: Not on file   Number of children: Not on file   Years of education: Not on file   Highest education level: Not on file  Occupational History   Not on file  Tobacco Use   Smoking status: Former   Smokeless tobacco: Never   Tobacco comments:    Smoked during college.    Substance and Sexual Activity   Alcohol use: Yes    Alcohol/week: 0.0 standard drinks of alcohol    Comment: 2 servings of  hard cider weekly   Drug use: No   Sexual activity: Not on file  Other Topics Concern   Not on file  Social History Narrative   Engaged to be married.     Social Drivers of Corporate investment banker Strain: Low Risk  (03/19/2023)   [facility]   Overall Financial Resource Strain (CARDIA)    Difficulty of Paying Living Expenses: Not hard at all  Food Insecurity: Low Risk  (03/24/2023)   [facility]   Hunger Vital Sign    Worried About Running Out of Food in  the Last Year: Never true    Ran Out of Food in the Last Year: Never true  Transportation Needs: No Transportation Needs (03/24/2023)   [facility]   Transportation    In the past 12 months, has lack of reliable transportation kept you from medical appointments, meetings, work or from getting things needed for daily living? : No  Physical Activity: Not on file  Stress: Not on file  Social Connections: Unknown (03/11/2023)   [facility]   Social Network    Social Network: Not on file    Additional Social History: grew up with parents, good no abuse, graduated college  Allergies:  No Known Allergies  Metabolic Disorder Labs: No results found for: "HGBA1C", "MPG" No results found for: "PROLACTIN" No results found for: "CHOL", "TRIG", "HDL", "CHOLHDL", "VLDL", "LDLCALC" Lab Results  Component Value Date   TSH 1.048 05/17/2015    Therapeutic Level Labs: No results found for: "LITHIUM" No results found for: "CBMZ" No results found for: "VALPROATE"  Current Medications: Current Outpatient Medications  Medication Sig Dispense Refill   amoxicillin-clavulanate (AUGMENTIN) 875-125 MG tablet Take 1 tablet by mouth 2 (two) times daily. 20 tablet 0   amphetamine-dextroamphetamine (ADDERALL) 10 MG tablet Take 1 tablet (10 mg total) by mouth daily. 30 tablet 0   diazepam (VALIUM) 10 MG tablet TAKE 1 TABLET (10 MG TOTAL) BY MOUTH DAILY AS NEEDED FOR ANXIETY. 30 tablet 1   meloxicam (MOBIC) 15 MG tablet Take 0.5-1 tablets (7.5-15 mg total) by mouth daily as needed. (Patient not taking: Reported on 05/24/2020) 30 tablet 2   PARoxetine (PAXIL) 30 MG tablet TAKE 1 TABLET(30 MG) BY MOUTH DAILY 30 tablet 0   No current facility-administered medications for this visit.     Psychiatric Specialty Exam: Review of Systems  Cardiovascular:  Negative for chest pain.  Skin:  Negative for rash.  Neurological:  Negative for tremors.   Psychiatric/Behavioral:  Negative for agitation.     There were no vitals taken for this visit.There is no height or weight on file to calculate BMI.  General Appearance: Fairly Groomed  Eye Contact:  Good  Speech:  Slow  Volume:  Decreased  Mood: better  Affect:  Congruent  Thought Process:  Goal Directed  Orientation:  Full (Time, Place, and Person)  Thought Content:  Rumination  Suicidal Thoughts:  No  Homicidal Thoughts:  No  Memory:  Immediate;   Fair  Judgement:  Fair  Insight:  Fair  Psychomotor Activity:  Normal  Concentration:  Concentration: Fair  Recall:  [employer] of Knowledge:Fair  Language: Good  Akathisia:  No  Handed:    AIMS (if indicated):  not done  Assets:  Desire for Improvement Financial Resources/Insurance Housing  ADL's:  Intact  Cognition: WNL  Sleep:  Fair   Screenings:  PHQ2-9    Flowsheet Row Counselor from 04/03/2023 in [facility] at [facility] Office Visit from 05/02/2022 in [facility] Behavior[facility] Health at [facility] Office Visit from 10/21/2019 in [facility] Total Score 2 0 0  PHQ-9 Total Score 7 -- --      Flowsheet Row Counselor from 04/03/2023 in [facility] Outpatient [facility]al [facility] at [facility] Video Visit from 07/30/2022 in [facility] Outpatient Behavioral Health at [facility] Video Visit from 06/04/2022 in [facility] Outpatient Behavioral Health at [facility]  C-SSRS RISK CATEGORY No Risk No Risk No Risk       Assessment and Plan: as follows   Prior documentation reviewed  Panic disorder; improved, continue paxil and prn valium  Social anxiety disorder;  better continue paxil and coping skills  ADHD;  manageable conitnue adderall, denies substance abuse, continue drug holidays on weekends   Fu 3 - 4 m.   Collaboration of Care: Other read Dr. Toni Arthurs referral and  reviewed notes  Patient/Guardian was advised Release of Information must be obtained prior to any record release in order to collaborate their care with an outside provider. Patient/Guardian was advised if they have not already done so to contact the registration department to sign all necessary forms in order for [place] to release information regarding their care.   Consent: Patient/Guardian gives verbal consent for treatment and assignment of benefits for services provided during this visit. Patient/Guardian expressed understanding and agreed to proceed.   Thresa Ross, MD 12/18/202411:42 AM

## 2023-10-15 ENCOUNTER — Telehealth (HOSPITAL_COMMUNITY): Payer: Self-pay | Admitting: *Deleted

## 2023-10-15 DIAGNOSIS — F900 Attention-deficit hyperactivity disorder, predominantly inattentive type: Secondary | ICD-10-CM

## 2023-10-15 NOTE — Telephone Encounter (Signed)
 REFILL REQUEST [facility] [place], [place] - [address]   amphetamine-dextroamphetamine (ADDERALL) 10 MG tablet   Next Appt  01/06/24 Last Appt   09/23/23

## 2023-10-16 MED ORDER — AMPHETAMINE-DEXTROAMPHETAMINE 10 MG PO TABS
10.0000 mg | ORAL_TABLET | Freq: Every day | ORAL | 0 refills | Status: DC
Start: 2023-10-16 — End: 2023-11-10

## 2023-10-16 NOTE — Addendum Note (Signed)
 Addended by: Thresa Ross on: 10/16/2023 09:15 AM   Modules accepted: Orders

## 2023-10-19 ENCOUNTER — Other Ambulatory Visit (HOSPITAL_COMMUNITY): Payer: Self-pay | Admitting: Psychiatry

## 2023-11-02 ENCOUNTER — Telehealth (HOSPITAL_COMMUNITY): Payer: Self-pay | Admitting: *Deleted

## 2023-11-02 MED ORDER — PAROXETINE HCL 30 MG PO TABS: 30.0000 mg | ORAL_TABLET | Freq: Every day | ORAL | 0 refills | Status: DC

## 2023-11-02 NOTE — Telephone Encounter (Signed)
Patient requested refills be sent to [facility] for when he returns home  amphetamine-dextroamphetamine (ADDERALL) 10 MG tablet   diazepam (VALIUM) 10 MG tablet

## 2023-11-02 NOTE — Addendum Note (Signed)
Addended by: Thresa Ross on: 11/02/2023 04:44 PM   Modules accepted: Orders

## 2023-11-02 NOTE — Telephone Encounter (Signed)
Patient called stated he's out of town in a training seminar in [place] & has lost /misplaced his medications. Stated the travel case they were packed in he can't find.  He requested med refills:: [id number] - [place], [place] - [address]   amphetamine-dextroamphetamine (ADDERALL) 10 MG tablet   diazepam (VALIUM) 10 MG tablet   PARoxetine (PAXIL) 30 MG tablet   Next Appt  01/06/24 Last Appt   09/23/23

## 2023-11-03 NOTE — Telephone Encounter (Signed)
Sent paxil. Will wait for others when due, no early refills on controlled substances

## 2023-11-09 ENCOUNTER — Telehealth (HOSPITAL_COMMUNITY): Payer: Self-pay | Admitting: *Deleted

## 2023-11-09 DIAGNOSIS — F900 Attention-deficit hyperactivity disorder, predominantly inattentive type: Secondary | ICD-10-CM

## 2023-11-09 NOTE — Telephone Encounter (Signed)
Patient requested refills be sent to [facility] He returned home   amphetamine-dextroamphetamine (ADDERALL) 10 MG tablet   diazepam (VALIUM) 10 MG tablet

## 2023-11-10 ENCOUNTER — Telehealth (HOSPITAL_COMMUNITY): Payer: Self-pay | Admitting: *Deleted

## 2023-11-10 DIAGNOSIS — F900 Attention-deficit hyperactivity disorder, predominantly inattentive type: Secondary | ICD-10-CM

## 2023-11-10 MED ORDER — AMPHETAMINE-DEXTROAMPHETAMINE 10 MG PO TABS: 10.0000 mg | ORAL_TABLET | Freq: Every day | ORAL | 0 refills | Status: DC

## 2023-11-10 MED ORDER — DIAZEPAM 10 MG PO TABS: 10.0000 mg | ORAL_TABLET | Freq: Every day | ORAL | 0 refills | Status: DC | PRN

## 2023-11-10 NOTE — Addendum Note (Signed)
Addended by: Thresa Ross on: 11/10/2023 10:00 AM   Modules accepted: Orders

## 2023-11-10 NOTE — Telephone Encounter (Signed)
Spoke with Rx concerning Refill-- amphetamine-dextroamphetamine (ADDERALL) 10 MG tablet   New script is needed stating OK to refill early due to last refill was on 10/20/23

## 2023-11-10 NOTE — Telephone Encounter (Signed)
amphetamine-dextroamphetamine (ADDERALL) 10 MG tablet  diazepam (VALIUM) 10 MG tablet Sent but need refill when due. No early refill on controlled substance

## 2023-11-10 NOTE — Telephone Encounter (Signed)
 Opened in Error.

## 2023-11-11 ENCOUNTER — Telehealth (HOSPITAL_COMMUNITY): Payer: Self-pay | Admitting: *Deleted

## 2023-11-11 MED ORDER — AMPHETAMINE-DEXTROAMPHETAMINE 10 MG PO TABS: 10.0000 mg | ORAL_TABLET | Freq: Every day | ORAL | 0 refills | Status: DC

## 2023-11-11 NOTE — Addendum Note (Signed)
 Addended by: Bethanne Mule on: 11/11/2023 09:05 AM   Modules accepted: Orders

## 2023-11-11 NOTE — Telephone Encounter (Signed)
 Sent for early refill this time  only. No more early refills after .

## 2023-11-11 NOTE — Telephone Encounter (Signed)
 Sent for early refill this time  only. No more early refills after . Adderall

## 2023-12-03 ENCOUNTER — Telehealth (HOSPITAL_COMMUNITY): Payer: Self-pay | Admitting: *Deleted

## 2023-12-03 NOTE — Telephone Encounter (Signed)
 REFILL REQUEST amphetamine-dextroamphetamine (ADDERALL) 10 MG tablet     LAST FILL DATE 11/11/23  [facility] - [place], [place] - [address]   Next Appt  01/06/24 Last Appt   09/23/23

## 2023-12-05 ENCOUNTER — Encounter (HOSPITAL_COMMUNITY): Payer: Self-pay

## 2023-12-07 ENCOUNTER — Telehealth (HOSPITAL_COMMUNITY): Payer: Self-pay | Admitting: *Deleted

## 2023-12-07 NOTE — Telephone Encounter (Signed)
 PER PROVIDER::We can not do a early refill as have done it before with condition no more early refills. We can refill when due and he can connect with out of state pharmacy and transfer if needed

## 2023-12-21 ENCOUNTER — Other Ambulatory Visit (HOSPITAL_COMMUNITY): Payer: Self-pay | Admitting: Psychiatry

## 2023-12-30 ENCOUNTER — Telehealth (HOSPITAL_COMMUNITY): Payer: Self-pay | Admitting: *Deleted

## 2023-12-30 DIAGNOSIS — F900 Attention-deficit hyperactivity disorder, predominantly inattentive type: Secondary | ICD-10-CM

## 2023-12-30 NOTE — Telephone Encounter (Signed)
 CALLED FOR REFILL REQUEST  5 DAY SUPPLY  amphetamine-dextroamphetamine (ADDERALL) 10 MG tablet   [facility] [place], [place] - [address]   Next Appt  01/06/24 Last Appt   09/23/23

## 2023-12-31 MED ORDER — AMPHETAMINE-DEXTROAMPHETAMINE 10 MG PO TABS: 10.0000 mg | ORAL_TABLET | Freq: Every day | ORAL | 0 refills | Status: DC

## 2023-12-31 NOTE — Addendum Note (Signed)
 Addended by: Thresa Ross on: 12/31/2023 11:55 AM   Modules accepted: Orders

## 2024-01-06 ENCOUNTER — Telehealth (HOSPITAL_COMMUNITY): Payer: BC Managed Care – PPO | Admitting: Psychiatry

## 2024-01-08 ENCOUNTER — Encounter (HOSPITAL_COMMUNITY): Payer: Self-pay | Admitting: Psychiatry

## 2024-01-08 ENCOUNTER — Telehealth (HOSPITAL_COMMUNITY): Admitting: Psychiatry

## 2024-01-08 DIAGNOSIS — F4010 Social phobia, unspecified: Secondary | ICD-10-CM

## 2024-01-08 DIAGNOSIS — F900 Attention-deficit hyperactivity disorder, predominantly inattentive type: Secondary | ICD-10-CM

## 2024-01-08 DIAGNOSIS — F418 Other specified anxiety disorders: Secondary | ICD-10-CM | POA: Diagnosis not present

## 2024-01-08 DIAGNOSIS — F410 Panic disorder [episodic paroxysmal anxiety] without agoraphobia: Secondary | ICD-10-CM | POA: Diagnosis not present

## 2024-01-08 MED ORDER — AMPHETAMINE-DEXTROAMPHETAMINE 5 MG PO TABS: 5.0000 mg | ORAL_TABLET | Freq: Every day | ORAL | Status: DC

## 2024-01-08 MED ORDER — PAROXETINE HCL 30 MG PO TABS: 30.0000 mg | ORAL_TABLET | Freq: Every day | ORAL | 0 refills | Status: DC

## 2024-01-08 NOTE — Progress Notes (Signed)
 [facility] Follow up visit  Patient Identification: KYEN TAITE MRN:  [id number] Date of Evaluation:  01/08/2024 Referral Source: Dr. Toni Arthurs psychiatrist Chief Complaint:  anxiety, performance anxiety, inattention No chief complaint on file. inattention Visit Diagnosis:    ICD-10-CM   1. Attention deficit hyperactivity disorder (ADHD), predominantly inattentive type  F90.0 amphetamine-dextroamphetamine (ADDERALL) 5 MG tablet    2. Panic disorder  F41.0     3. Social anxiety disorder  F40.10     4. Situational anxiety  F41.8     Virtual Visit via Video Note  I connected with Aloys Hupfer Koker on 01/08/24 at 10:30 AM EDT by a video enabled telemedicine application and verified that I am speaking with the correct person using two identifiers.  Location: Patient: car Provider: home office   I discussed the limitations of evaluation and management by telemedicine and the availability of in person appointments. The patient expressed understanding and agreed to proceed.      I discussed the assessment and treatment plan with the patient. The patient was provided an opportunity to ask questions and all were answered. The patient agreed with the plan and demonstrated an understanding of the instructions.   The patient was advised to call back or seek an in-person evaluation if the symptoms worsen or if the condition fails to improve as anticipated.  I provided 18 minutes of non-face-to-face time during this encounter.     History of Present Illness: Patient is a 33 years old currently married Caucasian male initially referred by psychiatrist Dr. Toni Arthurs as he is closing his practice diagnosed with ADD and anxiety condition He works as a Dance movement psychotherapist.  Married 2022  Has changed job and doing better, less anxious Wants to lower adderall On paxil and valium Understands not to take valium regularly Overall stress is better and attention manageable   No headaches, insomnia  Aggravating  factors;performance anxiety, job stress but resolving Modifying factors; dog, his family,  marriage  Duration more than 6 years Severity better  Past Psychiatric History: anxiety, adhd  Previous Psychotropic Medications: Yes   Substance Abuse History in the last 12 months:  Yes.    Consequences of Substance Abuse: Discussed effect of alcohol use to depression, anxiety and medications, judjement  Past Medical History:  Past Medical History:  Diagnosis Date   Anxiety     Past Surgical History:  Procedure Laterality Date   ELBOW SURGERY      Family Psychiatric History: Grand PA: Bipolar  Family History:  Family History  Problem Relation Age of Onset   Liver disease Father    Breast cancer Mother    Diabetes Mother    Stroke Maternal Grandmother    Bipolar disorder Paternal Grandfather     Social History:   Social History   Socioeconomic History   Marital status: Married    Spouse name: Not on file   Number of children: Not on file   Years of education: Not on file   Highest education level: Not on file  Occupational History   Not on file  Tobacco Use   Smoking status: Former   Smokeless tobacco: Never   Tobacco comments:    Smoked during college.    Substance and Sexual Activity   Alcohol use: Yes    Alcohol/week: 0.0 standard drinks of alcohol    Comment: 2 servings of hard cider weekly   Drug use: No   Sexual activity: Not on file  Other Topics Concern   Not on  file  Social History Narrative   Engaged to be married.     Social Drivers of Corporate investment banker Strain: Low Risk  (03/19/2023)   [facility]   Overall Financial Resource Strain (CARDIA)    Difficulty of Paying Living Expenses: Not hard at all  Food Insecurity: Low Risk  (03/24/2023)   [facility]   Hunger Vital Sign    Worried About Running Out of Food in the Last Year: Never true    Ran Out of Food in the Last Year:  Never true  Transportation Needs: No Transportation Needs (03/24/2023)   [facility]   Transportation    In the past 12 months, has lack of reliable transportation kept you from medical appointments, meetings, work or from getting things needed for daily living? : No  Physical Activity: Not on file  Stress: Not on file  Social Connections: Unknown (03/11/2023)   [facility]   Social Network    Social Network: Not on file    Additional Social History: grew up with parents, good no abuse, graduated college  Allergies:  No Known Allergies  Metabolic Disorder Labs: No results found for: "HGBA1C", "MPG" No results found for: "PROLACTIN" No results found for: "CHOL", "TRIG", "HDL", "CHOLHDL", "VLDL", "LDLCALC" Lab Results  Component Value Date   TSH 1.048 05/17/2015    Therapeutic Level Labs: No results found for: "LITHIUM" No results found for: "CBMZ" No results found for: "VALPROATE"  Current Medications: Current Outpatient Medications  Medication Sig Dispense Refill   amoxicillin-clavulanate (AUGMENTIN) 875-125 MG tablet Take 1 tablet by mouth 2 (two) times daily. 20 tablet 0   amphetamine-dextroamphetamine (ADDERALL) 5 MG tablet Take 1 tablet (5 mg total) by mouth daily.     diazepam (VALIUM) 10 MG tablet TAKE ONE TABLET BY MOUTH DAILY AS NEEDED FOR ANXIETY 30 tablet 0   meloxicam (MOBIC) 15 MG tablet Take 0.5-1 tablets (7.5-15 mg total) by mouth daily as needed. (Patient not taking: Reported on 05/24/2020) 30 tablet 2   PARoxetine (PAXIL) 30 MG tablet Take 1 tablet (30 mg total) by mouth daily. 30 tablet 0   No current facility-administered medications for this visit.     Psychiatric Specialty Exam: Review of Systems  Cardiovascular:  Negative for chest pain.  Skin:  Negative for rash.  Neurological:  Negative for tremors.  Psychiatric/Behavioral:  Negative for agitation.     There were no vitals taken for  this visit.There is no height or weight on file to calculate BMI.  General Appearance: Fairly Groomed  Eye Contact:  Good  Speech:  Slow  Volume:  Decreased  Mood: better  Affect:  Congruent  Thought Process:  Goal Directed  Orientation:  Full (Time, Place, and Person)  Thought Content:  Rumination  Suicidal Thoughts:  No  Homicidal Thoughts:  No  Memory:  Immediate;   Fair  Judgement:  Fair  Insight:  Fair  Psychomotor Activity:  Normal  Concentration:  Concentration: Fair  Recall:  [employer] of Knowledge:Fair  Language: Good  Akathisia:  No  Handed:    AIMS (if indicated):  not done  Assets:  Desire for Improvement Financial Resources/Insurance Housing  ADL's:  Intact  Cognition: WNL  Sleep:  Fair   Screenings: Insurance account manager from 04/03/2023 in [facility] Health at [facility] Office Visit from 05/02/2022 in [facility]  Outpatient [facility] Health at [facility] Office Visit from 10/21/2019 in [facility] at [facility] Total Score 2 0 0  PHQ-9 Total Score 7 -- --      Flowsheet Row Counselor from 04/03/2023 in [facility] at [facility] Video Visit from 07/30/2022 in [facility] Outpatient Behavioral Health at [facility] Video Visit from 06/04/2022 in [facility] Outpatient Behavioral Health at [facility]  C-SSRS RISK CATEGORY No Risk No Risk No Risk       Assessment and Plan: as follows  Prior documentation reviewed  Panic disorder; improved, change valium to prn, not to refill every 30 days  Social anxiety disorder;  better continue paxil and coping skills  ADHD;  manageable, will lower to adderall 5mg , says he will lower and then stop in next 15 days  Discussed weight maintenance, consult with nutritionist , pcp and work on management    Fu 3 - 4 m.   Collaboration of Care: Other read Dr. Toni Arthurs  referral and reviewed notes  Patient/Guardian was advised Release of Information must be obtained prior to any record release in order to collaborate their care with an outside provider. Patient/Guardian was advised if they have not already done so to contact the registration department to sign all necessary forms in order for [place] to release information regarding their care.   Consent: Patient/Guardian gives verbal consent for treatment and assignment of benefits for services provided during this visit. Patient/Guardian expressed understanding and agreed to proceed.   Thresa Ross, MD 4/4/202510:48 AM

## 2024-01-19 ENCOUNTER — Other Ambulatory Visit (HOSPITAL_COMMUNITY): Payer: Self-pay | Admitting: Psychiatry

## 2024-01-25 ENCOUNTER — Telehealth: Payer: Self-pay

## 2024-01-25 DIAGNOSIS — F900 Attention-deficit hyperactivity disorder, predominantly inattentive type: Secondary | ICD-10-CM

## 2024-01-25 MED ORDER — AMPHETAMINE-DEXTROAMPHETAMINE 5 MG PO TABS: 5.0000 mg | ORAL_TABLET | Freq: Every day | ORAL | 0 refills | Status: DC

## 2024-01-25 MED ORDER — AMPHETAMINE-DEXTROAMPHETAMINE 5 MG PO TABS: 5.0000 mg | ORAL_TABLET | Freq: Every day | ORAL | Status: DC

## 2024-01-25 NOTE — Telephone Encounter (Signed)
 Patient called requesting a refill of   amphetamine -dextroamphetamine  (ADDERALL) 5 MG tablet    [facility] [place], [place] - [address] Phone: [phone]  Fax: [phone]     Last fill date 12-31-23  Last visit 01-08-24  Next visit 04-18-24

## 2024-01-25 NOTE — Addendum Note (Signed)
 Addended by: Samah Lapiana on: 01/25/2024 04:38 PM   Modules accepted: Orders

## 2024-02-18 ENCOUNTER — Other Ambulatory Visit: Payer: Self-pay | Admitting: Psychiatry

## 2024-02-18 DIAGNOSIS — F900 Attention-deficit hyperactivity disorder, predominantly inattentive type: Secondary | ICD-10-CM

## 2024-02-19 ENCOUNTER — Telehealth (HOSPITAL_COMMUNITY): Payer: Self-pay | Admitting: *Deleted

## 2024-02-19 NOTE — Telephone Encounter (Signed)
Refill adderall sent

## 2024-02-19 NOTE — Telephone Encounter (Signed)
 Patient called requesting a refill of    amphetamine -dextroamphetamine  (ADDERALL) 5 MG tablet      [facility] [place], [place] - [address] [place]       Last visit 01-08-24   Next visit 04-18-24

## 2024-03-08 ENCOUNTER — Telehealth (HOSPITAL_COMMUNITY): Payer: Self-pay | Admitting: *Deleted

## 2024-03-08 DIAGNOSIS — F900 Attention-deficit hyperactivity disorder, predominantly inattentive type: Secondary | ICD-10-CM

## 2024-03-08 MED ORDER — AMPHETAMINE-DEXTROAMPHETAMINE 5 MG PO TABS
5.0000 mg | ORAL_TABLET | Freq: Every day | ORAL | 0 refills | Status: DC
Start: 2024-03-08 — End: 2024-04-18

## 2024-03-08 MED ORDER — DIAZEPAM 10 MG PO TABS: 10.0000 mg | ORAL_TABLET | Freq: Every day | ORAL | 0 refills | Status: DC | PRN

## 2024-03-08 NOTE — Telephone Encounter (Signed)
 Rx Request [facility] [place], [place] - [address]   diazepam  (VALIUM ) 10 MG tablet   Next visit 04-18-24 Last visit 01-08-24

## 2024-03-08 NOTE — Telephone Encounter (Signed)
 Rx Request [facility] [place], [place] - [address]    amphetamine -dextroamphetamine  (ADDERALL) 5 MG tablet    Next visit 04-18-24 Last visit 01-08-24

## 2024-03-08 NOTE — Addendum Note (Signed)
 Addended by: Reneka Nebergall on: 03/08/2024 08:44 AM   Modules accepted: Orders

## 2024-04-04 ENCOUNTER — Telehealth (HOSPITAL_COMMUNITY): Payer: Self-pay | Admitting: Psychiatry

## 2024-04-04 MED ORDER — PAROXETINE HCL 30 MG PO TABS: 30.0000 mg | ORAL_TABLET | Freq: Every day | ORAL | 0 refills | Status: DC

## 2024-04-04 NOTE — Telephone Encounter (Signed)
 Received refill request via fax from [facility] pharmacy not on patient's pharmacy profile. Called to clarify preferred pharmacy and he confirmed this pharmacy is preferred due to better notification. Requesting:   PARoxetine  (PAXIL ) 30 MG tablet  Last ordered 01/08/2024 - 30 tablets  [id number] - [place], [place] - [address] AT [address] [address] Phone: [phone]  Fax: [phone]     Last visit: 01/08/2024 Next visit: 04/18/2024

## 2024-04-12 NOTE — Telephone Encounter (Signed)
 Rx sent and pharmacy will contact pt.

## 2024-04-15 ENCOUNTER — Other Ambulatory Visit (HOSPITAL_COMMUNITY): Payer: Self-pay | Admitting: Psychiatry

## 2024-04-15 ENCOUNTER — Telehealth (HOSPITAL_COMMUNITY): Payer: Self-pay | Admitting: *Deleted

## 2024-04-15 ENCOUNTER — Other Ambulatory Visit: Payer: Self-pay | Admitting: Psychiatry

## 2024-04-15 DIAGNOSIS — F900 Attention-deficit hyperactivity disorder, predominantly inattentive type: Secondary | ICD-10-CM

## 2024-04-15 NOTE — Telephone Encounter (Signed)
 [facility] [place], [place] - [address]    amphetamine -dextroamphetamine  (ADDERALL) 5 MG tablet    diazepam  (VALIUM ) 10 MG tablet   Next visit 04-18-24 Last visit 01-08-24

## 2024-04-18 ENCOUNTER — Encounter (HOSPITAL_COMMUNITY): Payer: Self-pay | Admitting: Psychiatry

## 2024-04-18 ENCOUNTER — Telehealth (INDEPENDENT_AMBULATORY_CARE_PROVIDER_SITE_OTHER): Admitting: Psychiatry

## 2024-04-18 DIAGNOSIS — F418 Other specified anxiety disorders: Secondary | ICD-10-CM

## 2024-04-18 DIAGNOSIS — F410 Panic disorder [episodic paroxysmal anxiety] without agoraphobia: Secondary | ICD-10-CM

## 2024-04-18 DIAGNOSIS — F900 Attention-deficit hyperactivity disorder, predominantly inattentive type: Secondary | ICD-10-CM

## 2024-04-18 MED ORDER — AMPHETAMINE-DEXTROAMPHETAMINE 5 MG PO TABS: 1.0000 | ORAL_TABLET | Freq: Every day | ORAL | 0 refills | Status: DC

## 2024-04-18 MED ORDER — DIAZEPAM 10 MG PO TABS: 10.0000 mg | ORAL_TABLET | Freq: Every day | ORAL | 1 refills | Status: DC | PRN

## 2024-04-18 NOTE — Progress Notes (Signed)
 [facility] Follow up visit  Patient Identification: Kyle Ward MRN:  [id number] Date of Evaluation:  04/18/2024 Referral Source: Dr. Melba psychiatrist Chief Complaint:  anxiety, performance anxiety, inattention No chief complaint on file. inattention Visit Diagnosis:    ICD-10-CM   1. Attention deficit hyperactivity disorder (ADHD), predominantly inattentive type  F90.0 amphetamine -dextroamphetamine  (ADDERALL) 5 MG tablet    2. Panic disorder  F41.0     3. Situational anxiety  F41.8     Virtual Visit via Video Note  I connected with Montae Stager Kaiser on 04/18/24 at  1:00 PM EDT by a video enabled telemedicine application and verified that I am speaking with the correct person using two identifiers.  Location: Patient: home Provider: home office   I discussed the limitations of evaluation and management by telemedicine and the availability of in person appointments. The patient expressed understanding and agreed to proceed.     I discussed the assessment and treatment plan with the patient. The patient was provided an opportunity to ask questions and all were answered. The patient agreed with the plan and demonstrated an understanding of the instructions.   The patient was advised to call back or seek an in-person evaluation if the symptoms worsen or if the condition fails to improve as anticipated.  I provided 20 minutes of non-face-to-face time during this encounter.     History of Present Illness: Patient is a 33 years old currently married Caucasian male initially referred by psychiatrist Dr. Melba as he is closing his practice diagnosed with ADD and anxiety condition He works as a Dance movement psychotherapist.  Married 2022  Last visit he was doing better and we have recommended to lower down the Adderall from 10 to 5 mg job stress is manageable as well he just did not take Valium  on a regular basis  On evaluation today he is stating that he has been seeing cardiologist because of AV block  and is being monitored.  He has also an assessment for ruling out sleep apnea as he is endorsing tiredness and fatigue  States he will get more blood work related to any workup needed by cardiologist As of now denies chest pain  We discussed not to take Adderall but apparently is already picked it up before this appointment is strongly discouraged to take that considering that he is going through evaluation with cardiology and possible AV blocks He also recommended to lower down the Valium  and not to take it regularly and keep providers including cardiology involved.  She states that the cardiology is aware of his medications including Valium   The only other new medication that he has been taking that he has stated today is Zepbound for weight loss he does notice that it causes nausea.  We discussed to evaluate with the primary care physician if he needs to continue considering he is going through cardiology evaluation and there has been some concern about AV blocks recently and this is one of the medication that has been started since last visit  He is also trying to process medical leave so he can work on his medical comorbidity as above  Aggravating factors;performance anxiety, job stress Modifying factors; dog, his family, marriage  Duration more than 6 years Severity gets anxious  Past Psychiatric History: anxiety, adhd  Previous Psychotropic Medications: Yes   Substance Abuse History in the last 12 months:  Yes.    Consequences of Substance Abuse: Discussed effect of alcohol use to depression, anxiety and medications, judjement  Past  Medical History:  Past Medical History:  Diagnosis Date   Anxiety     Past Surgical History:  Procedure Laterality Date   ELBOW SURGERY      Family Psychiatric History: Grand PA: Bipolar  Family History:  Family History  Problem Relation Age of Onset   Liver disease Father    Breast cancer Mother    Diabetes Mother    Stroke Maternal  Grandmother    Bipolar disorder Paternal Grandfather     Social History:   Social History   Socioeconomic History   Marital status: Married    Spouse name: Not on file   Number of children: Not on file   Years of education: Not on file   Highest education level: Not on file  Occupational History   Not on file  Tobacco Use   Smoking status: Former   Smokeless tobacco: Never   Tobacco comments:    Smoked during college.    Substance and Sexual Activity   Alcohol use: Yes    Alcohol/week: 0.0 standard drinks of alcohol    Comment: 2 servings of hard cider weekly   Drug use: No   Sexual activity: Not on file  Other Topics Concern   Not on file  Social History Narrative   Engaged to be married.     Social Drivers of Corporate investment banker Strain: Low Risk  (04/14/2024)   Received from [facility]   Overall Financial Resource Strain (CARDIA)    Difficulty of Paying Living Expenses: Not hard at all  Food Insecurity: No Food Insecurity (04/14/2024)   Received from [facility]   Hunger Vital Sign    Within the past 12 months, you worried that your food would run out before you got the money to buy more.: Never true    Within the past 12 months, the food you bought just didn't last and you didn't have money to get more.: Never true  Transportation Needs: No Transportation Needs (04/14/2024)   [facility] from [facility]    Lack of Transportation (Medical): No    Lack of Transportation (Non-Medical): No  Physical Activity: Not on file  Stress: Not on file  Social Connections: Unknown (03/11/2023)   Received from [facility]   Social Network    Social Network: Not on file    Additional Social History: grew up with parents, good no abuse, graduated college  Allergies:  No Known Allergies  Metabolic Disorder Labs: No results found for: HGBA1C, MPG No results found for: PROLACTIN No results found for: CHOL, TRIG, HDL,  CHOLHDL, VLDL, LDLCALC Lab Results  Component Value Date   TSH 1.048 05/17/2015    Therapeutic Level Labs: No results found for: LITHIUM No results found for: CBMZ No results found for: VALPROATE  Current Medications: Current Outpatient Medications  Medication Sig Dispense Refill   ZEPBOUND 10 MG/0.5ML Pen Inject 10 mg into the skin once a week.     amoxicillin -clavulanate (AUGMENTIN ) 875-125 MG tablet Take 1 tablet by mouth 2 (two) times daily. 20 tablet 0   amphetamine -dextroamphetamine  (ADDERALL) 5 MG tablet Take 1 tablet (5 mg total) by mouth daily. 1 tablet 0   diazepam  (VALIUM ) 10 MG tablet Take 1 tablet (10 mg total) by mouth daily as needed for anxiety. 30 tablet 1   meloxicam  (MOBIC ) 15 MG tablet Take 0.5-1 tablets (7.5-15 mg total) by mouth daily as needed. (Patient not taking: Reported on 05/24/2020) 30 tablet 2  PARoxetine  (PAXIL ) 30 MG tablet Take 1 tablet (30 mg total) by mouth daily. 30 tablet 0   No current facility-administered medications for this visit.     Psychiatric Specialty Exam: Review of Systems  Cardiovascular:  Negative for chest pain.  Skin:  Negative for rash.  Neurological:  Negative for tremors.  Psychiatric/Behavioral:  Negative for agitation.     There were no vitals taken for this visit.There is no height or weight on file to calculate BMI.  General Appearance: Fairly Groomed  Eye Contact:  Good  Speech:  Slow  Volume:  Decreased  Mood: Somewhat stressed  Affect:  Congruent  Thought Process:  Goal Directed  Orientation:  Full (Time, Place, and Person)  Thought Content:  Rumination  Suicidal Thoughts:  No  Homicidal Thoughts:  No  Memory:  Immediate;   Fair  Judgement:  Fair  Insight:  Fair  Psychomotor Activity:  Normal  Concentration:  Concentration: Fair  Recall:  [employer] of Knowledge:Fair  Language: Good  Akathisia:  No  Handed:    AIMS (if indicated):  not done  Assets:  Desire for Improvement Financial  Resources/Insurance Housing  ADL's:  Intact  Cognition: WNL  Sleep:  Fair   Screenings: Insurance account manager from 04/03/2023 in [facility] at [facility] Office Visit from 05/02/2022 in [facility] Office Visit from 10/21/2019 in [facility] Total Score 2 0 0  PHQ-9 Total Score 7 -- --   Flowsheet Row Counselor from 04/03/2023 in [facility] at [facility] Video Visit from 07/30/2022 in [facility]patient [facility]al [facility] at [facility] Video Visit from 06/04/2022 in [facility] at [facility]  C-SSRS RISK CATEGORY No Risk No Risk No Risk    Assessment and Plan: as follows Prior documentation reviewed  Panic disorder; was doing better still has sporadic panic attacks I do recommend that he start doing therapy again and he will call to reschedule continue Paxil  for now I did recommend that he discuss his medication with the providers we will hold off from Adderall highly recommend this not to be taken considering his anxiety level and also that he is getting evaluation with cardiology   Also recommend to lower the dose from Valium  of 10 mg to 5 mg and take it only if needed and to keep other providers including cardiology involved do not abruptly discontinue Valium    social anxiety disorder; manageable continue coping skills and Paxil   ADHD; gets distracted but because of his anxiety we will hold off from Adderall continue time management  Continue follow-up with providers including he is being evaluated for sleep apnea this week highly recommend to keep that appointment and to follow-up with schedule therapy or find a therapist to work on coping skills and anxiety Patient to follow with cardiology and return back earlier for any questions and to  keep us  updated for any medication review or changes suggested   Follow-up in 58-month or within 6 weeks or earlier if needed  Collaboration of Care: Other read Dr. Melba referral and reviewed notes  Patient/Guardian was advised Release of Information must be obtained prior to any record release in order to collaborate their care with an outside provider. Patient/Guardian was advised if they have not already done so to contact the registration department to sign all necessary forms in order for  us  to release information regarding their care.   Consent: Patient/Guardian gives verbal consent for treatment and assignment of benefits for services provided during this visit. Patient/Guardian expressed understanding and agreed to proceed.   Jackey Flight, MD 7/14/20251:19 PM

## 2024-04-18 NOTE — Addendum Note (Signed)
 Addended by: GERALENE KAISER on: 04/18/2024 09:51 AM   Modules accepted: Orders

## 2024-05-13 ENCOUNTER — Ambulatory Visit (INDEPENDENT_AMBULATORY_CARE_PROVIDER_SITE_OTHER): Admitting: Licensed Clinical Social Worker

## 2024-05-13 DIAGNOSIS — F900 Attention-deficit hyperactivity disorder, predominantly inattentive type: Secondary | ICD-10-CM

## 2024-05-13 DIAGNOSIS — F410 Panic disorder [episodic paroxysmal anxiety] without agoraphobia: Secondary | ICD-10-CM | POA: Diagnosis not present

## 2024-05-13 NOTE — Progress Notes (Signed)
 THERAPIST PROGRESS NOTE  Session Time: 9:05 am-9:45 am  Type of Therapy: Individual Therapy  Purpose of session: Kyle Ward will manage anxiety as evidenced by increasing physical wellness, identify and challenging anxious thoughts, regulate physical and cognitive reactions, increase zest for life, and improve sleep for 5 out of 7 days for 60 days.  ProgressTowards Goals: Progressing  Interventions: Therapist utilized CBT and solution focused brief therapy to address anxiety. Therapist provided support and empathy to patient during session. Therapist administered the PHQ9 and GAD7 to patient. Therapist processed patient's feelings to identify triggers for anxiety and focusing on physical wellness.    Effectiveness: Patient was oriented x4 (person, place, situation, and time). Patient was casually dressed, and appropriately groomed. Patient was alert, engaged, pleasant, and cooperative. Patient completed the PHQ9 and GAD7. Patient shared that he has had to go on medical leave. He was enjoying his job but he started to have health issues. Patient is having cardiac issues and will eventually need a pace maker. Patient is experiencing increased in sweating with minimal effort. He is not sure what his limits are so he is taking it easy but it is also frustrating him. He is fearful of what is going on with his health. He doesn't want to leave his wife without a husband and his daughter without a father. Patient is taking care of his health. Due to his health, he is unable to work his job. He noted his job also filed for chapter 11 and feels like in a few years they may close down. Patient has been fatigued. He is worried that even if he gets another job he may not be able to do it due to his fatigue. Patient has a sleep study coming up and hopes that a CPAP will help. Patient is focusing on what he can control right now.   Patient engaged in session. Patient responded well to interventions. Patient continues  to meet criteria for Panic Disorder. Patient will continue in outpatient therapy due to being the least restrictive service to meet her needs. Patient made moderate progress on his goals.      05/13/2024    9:12 AM 04/04/2023   10:47 AM 05/02/2022    9:20 AM 10/21/2019    9:09 AM  Depression screen PHQ 2/9  Decreased Interest  1 0 0  Down, Depressed, Hopeless 2 1 0 0  PHQ - 2 Score 2 2 0 0  Altered sleeping 3 1    Tired, decreased energy 3 1    Change in appetite 3 1    Feeling bad or failure about yourself  3 1    Trouble concentrating 3 1    Moving slowly or fidgety/restless 2 0    Suicidal thoughts 0 0    PHQ-9 Score 19 7    Difficult doing work/chores  Very difficult         05/13/2024    9:13 AM  GAD 7 : Generalized Anxiety Score  Nervous, Anxious, on Edge 3  Control/stop worrying 3  Worry too much - different things 3  Trouble relaxing 2  Restless 2  Easily annoyed or irritable 2  Afraid - awful might happen 2  Total GAD 7 Score 17  Anxiety Difficulty Extremely difficult      Suicidal/Homicidal: Nowithout intent/plan  Plan: Return again in 2-4 weeks.  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 Patient/Guardian was advised if they have not already done so to contact the registration department to sign all necessary forms in order for us  to release information regarding their care.   Consent: Patient/Guardian gives verbal consent for treatment and assignment of benefits for services provided during this visit. Patient/Guardian expressed understanding and agreed to proceed.   Fonda Conroy, LCSW 05/13/2024

## 2024-05-18 ENCOUNTER — Ambulatory Visit (INDEPENDENT_AMBULATORY_CARE_PROVIDER_SITE_OTHER): Admitting: Licensed Clinical Social Worker

## 2024-05-18 DIAGNOSIS — F410 Panic disorder [episodic paroxysmal anxiety] without agoraphobia: Secondary | ICD-10-CM | POA: Diagnosis not present

## 2024-05-18 DIAGNOSIS — F900 Attention-deficit hyperactivity disorder, predominantly inattentive type: Secondary | ICD-10-CM | POA: Diagnosis not present

## 2024-05-18 NOTE — Progress Notes (Signed)
 THERAPIST PROGRESS NOTE  Session Time: 3:05 pm-3:45 pm  Type of Therapy: Individual Therapy  Treatment Goals addressed: Carlee will manage anxiety as evidenced by increasing physical wellness, identify and challenging anxious thoughts, regulate physical and cognitive reactions, increase zest for life, and improve sleep for 5 out of 7 days for 60 days   Session Goal: Anxiety, acceptance, Anxious thoughts, Physical wellness   Behavior: Patient reported stress/anxiety at some of the unknowns related to his health and job in the future. He has experienced excessive sweating and constant fatigue when doing small activities. Patient got a CPAP earlier in the day and will be trying it to treat his mild sleep apnea. Patient also experiences a consistent tightness in his chest which he states feels like an infection. Patient feels like these symptoms are similar but different from his usual anxiety symptoms. Patient was glad to get bottom line information from his doctor including that he will need a pace maker. Patient was eased by a lot of the information he has got from doctors but now has some new anxieties due to unknowns. Patient was oriented x4 (person, place, situation, and time) . Patient was  Anxious. Patient was Casual. Patient made minimal progress on his goals at this time.   Interventions: Therapist utilized CBT, ACT,  and Solution Focused brief therapy to address anxiety. Therapist provided support and empathy to patient during session. Therapist worked with patient on identifying possible paths forward for professional life and his health. Therapist worked with patient on challenging anxious thoughts accepting current health and identifying the positives.    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 Patient was able to identify a path forward professionally such as applying for  remote positions within his company, applying for disability and working very part time as needed, and changing careers if he had to. While many factors will play a role in the future, patient has some ideas of how to proceed. Patient was able to be accept that he is getting a pace maker, and identify that heart disease has taken out many people in his family in the past but he has intervention early that will extend his life.   Patient engaged in session. Patient responded well to interventions. 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   Plan: Patient will return in 2-4 weeks. Patient will talk with his wife about optional paths forward and what that would look like including her going back to work as needed. Patient will use his CPAP to improve sleep and reduce fatigue. Patient will continue normal activities but not push himself physically to reduce physical symptoms to increase which could trigger a panic attack.    Suicidal/Homicidal:  No without intent/plan   Recent stressful life event  Protective Factors: responsibility to others (children, family) and hope for the future  Diagnosis: Axis I: Panic disorder  Attention deficit hyperactivity disorder (ADHD), predominantly inattentive type    Collaboration of Care: Psychiatrist AEB Dr. Jackey Flight  Patient/Guardian was advised Release of Information must be obtained prior to any record release in order to collaborate their care with an outside provider. Patient/Guardian was advised if they have not already done so to contact the registration department to sign all necessary forms in  order for us  to release information regarding their care.   Consent: Patient/Guardian gives verbal consent for treatment and assignment of benefits for  services provided during this visit. Patient/Guardian expressed understanding and agreed to proceed.

## 2024-05-19 ENCOUNTER — Telehealth (HOSPITAL_COMMUNITY): Payer: Self-pay

## 2024-05-19 MED ORDER — PAROXETINE HCL 30 MG PO TABS: 30.0000 mg | ORAL_TABLET | Freq: Every day | ORAL | 1 refills | Status: DC

## 2024-05-19 NOTE — Telephone Encounter (Signed)
 pt called states he needs a refill on the paxil  30mg . please send to the [facility] [place],  [address]. pt was last seen on 7-14 next appt 9-3

## 2024-05-20 NOTE — Telephone Encounter (Signed)
 left message that rx was sent to the pharmacy

## 2024-06-02 DIAGNOSIS — Z0289 Encounter for other administrative examinations: Secondary | ICD-10-CM

## 2024-06-08 ENCOUNTER — Encounter (HOSPITAL_COMMUNITY): Payer: Self-pay | Admitting: Psychiatry

## 2024-06-08 ENCOUNTER — Telehealth (HOSPITAL_COMMUNITY): Admitting: Psychiatry

## 2024-06-08 DIAGNOSIS — F909 Attention-deficit hyperactivity disorder, unspecified type: Secondary | ICD-10-CM

## 2024-06-08 DIAGNOSIS — F4010 Social phobia, unspecified: Secondary | ICD-10-CM

## 2024-06-08 DIAGNOSIS — F410 Panic disorder [episodic paroxysmal anxiety] without agoraphobia: Secondary | ICD-10-CM | POA: Diagnosis not present

## 2024-06-08 NOTE — Progress Notes (Signed)
 [facility] Follow up visit  Patient Identification: Kyle Ward MRN:  [id number] Date of Evaluation:  06/08/2024 Referral Source: Dr. Melba psychiatrist Chief Complaint:  anxiety, performance anxiety, inattention No chief complaint on file.  Visit Diagnosis:    ICD-10-CM   1. Panic disorder  F41.0     2. Social anxiety disorder  F40.10     Virtual Visit via Video Note  I connected with Kyle Ward on 06/08/24 at 10:30 AM EDT by a video enabled telemedicine application and verified that I am speaking with the correct person using two identifiers.  Location: Patient: home Provider: home office   I discussed the limitations of evaluation and management by telemedicine and the availability of in person appointments. The patient expressed understanding and agreed to proceed.      I discussed the assessment and treatment plan with the patient. The patient was provided an opportunity to ask questions and all were answered. The patient agreed with the plan and demonstrated an understanding of the instructions.   The patient was advised to call back or seek an in-person evaluation if the symptoms worsen or if the condition fails to improve as anticipated.  I provided 20 minutes of non-face-to-face time during this encounter.     History of Present Illness: Patient is a 33 years old currently married Caucasian male initially referred by psychiatrist Dr. Melba as he is closing his practice diagnosed with ADD and anxiety condition He works as a Dance movement psychotherapist.  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  He is trying to get an earlier appointment but due to cardiology appointment is in October end of that month.  Discussed to avoid coffee and also we are  already stopped Adderall He is applied for short-term disability paperwork was completed for that as he feels that he cannot work he struggles with activity and worries about activity affecting heart and causes anxiety He is taking Valium  10 mg or half a tablet at night and understands not to increase the dose at it also can add to fatigue  Has a supportive family but financially is in stress because of unpredictability of work or be able to work on medical leave and may apply for disability    Aggravating factors;performance anxiety, job stress, heart condition Modifying factors; dog, his family, marriage  Duration more than 6 years Severity worry for all  Past Psychiatric History: anxiety, adhd  Previous Psychotropic Medications: Yes   Substance Abuse History in the last 12 months:  Yes.    Consequences of Substance Abuse: Discussed effect of alcohol use to depression, anxiety and medications, judjement  Past Medical History:  Past Medical History:  Diagnosis Date   Anxiety     Past Surgical History:  Procedure Laterality Date   ELBOW SURGERY      Family Psychiatric History: Grand PA: Bipolar  Family History:  Family History  Problem Relation Age of Onset   Liver disease Father    Breast cancer Mother    Diabetes Mother    Stroke Maternal Grandmother    Bipolar disorder Paternal Grandfather     Social History:   Social History   Socioeconomic History   Marital status: Married    Spouse name: Not on file   Number  of children: Not on file   Years of education: Not on file   Highest education level: Not on file  Occupational History   Not on file  Tobacco Use   Smoking status: Former   Smokeless tobacco: Never   Tobacco comments:    Smoked during college.    Substance and Sexual Activity   Alcohol use: Yes    Alcohol/week: 0.0 standard drinks of alcohol    Comment: 2 servings of hard cider weekly   Drug use: No   Sexual activity: Not on file  Other  Topics Concern   Not on file  Social History Narrative   Engaged to be married.     Social Drivers of Corporate investment banker Strain: Low Risk  (04/14/2024)   Received from [facility]   Overall Financial Resource Strain (CARDIA)    Difficulty of Paying Living Expenses: Not hard at all  Food Insecurity: No Food Insecurity (04/14/2024)   Received from [facility]   Hunger Vital Sign    Within the past 12 months, you worried that your food would run out before you got the money to buy more.: Never true    Within the past 12 months, the food you bought just didn't last and you didn't have money to get more.: Never true  Transportation Needs: No Transportation Needs (04/14/2024)   Received from [facility] - Transportation    Lack of Transportation (Medical): No    Lack of Transportation (Non-Medical): No  Physical Activity: Not on file  Stress: Not on file  Social Connections: Unknown (03/11/2023)   Received from [facility]   Social Network    Social Network: Not on file    Additional Social History: grew up with parents, good no abuse, graduated college  Allergies:  No Known Allergies  Metabolic Disorder Labs: No results found for: HGBA1C, MPG No results found for: PROLACTIN No results found for: CHOL, TRIG, HDL, CHOLHDL, VLDL, LDLCALC Lab Results  Component Value Date   TSH 1.048 05/17/2015    Therapeutic Level Labs: No results found for: LITHIUM No results found for: CBMZ No results found for: VALPROATE  Current Medications: Current Outpatient Medications  Medication Sig Dispense Refill   amoxicillin -clavulanate (AUGMENTIN ) 875-125 MG tablet Take 1 tablet by mouth 2 (two) times daily. 20 tablet 0   diazepam  (VALIUM ) 10 MG tablet Take 1 tablet (10 mg total) by mouth daily as needed for anxiety. 30 tablet 1   meloxicam  (MOBIC ) 15 MG tablet Take 0.5-1 tablets (7.5-15 mg total) by mouth daily as needed. (Patient not taking:  Reported on 05/24/2020) 30 tablet 2   PARoxetine  (PAXIL ) 30 MG tablet Take 1 tablet (30 mg total) by mouth daily. 30 tablet 1   ZEPBOUND 10 MG/0.5ML Pen Inject 10 mg into the skin once a week.     No current facility-administered medications for this visit.     Psychiatric Specialty Exam: Review of Systems  Cardiovascular:  Negative for chest pain.  Skin:  Negative for rash.  Neurological:  Negative for tremors.  Psychiatric/Behavioral:  Negative for agitation.     There were no vitals taken for this visit.There is no height or weight on file to calculate BMI.  General Appearance: Fairly Groomed  Eye Contact:  Good  Speech:  Slow  Volume:  Decreased  Mood:stressed  Affect:  Congruent  Thought Process:  Goal Directed  Orientation:  Full (Time, Place, and Person)  Thought Content:  Rumination  Suicidal  Thoughts:  No  Homicidal Thoughts:  No  Memory:  Immediate;   Fair  Judgement:  Fair  Insight:  Fair  Psychomotor Activity:  Normal  Concentration:  Concentration: Fair  Recall:  [employer] of Knowledge:Fair  Language: Good  Akathisia:  No  Handed:    AIMS (if indicated):  not done  Assets:  Desire for Improvement Financial Resources/Insurance Housing  ADL's:  Intact  Cognition: WNL  Sleep:  Fair   Screenings: GAD-7    Advertising copywriter from 05/13/2024 in [facility] at [facility]  Total GAD-7 Score 17   PHQ2-9    Flowsheet Row Counselor from 05/13/2024 in [facility] at [facility] Counselor from 04/03/2023 in [facility] at [facility] Office Visit from 05/02/2022 in [facility] at [facility] Office Visit from 10/21/2019 in [facility] at [facility] Total Score 2 2 0 0  PHQ-9 Total Score 19 7 -- --   Flowsheet Row Video Visit from 04/18/2024 in [facility] Out[facility]al Health at [facility] Counselor from 04/03/2023 in [facility]al Health at [facility] Video Visit from 07/30/2022 in [facility] at [facility]  C-SSRS RISK CATEGORY No Risk No Risk No Risk    Assessment and Plan: as follows  Prior documentation reviewed   Panic disorder; continue Paxil  and therapy to work on panic attacks and supportive therapy regarding the current stressors including his heart condition.  He is also on Valium  advised not to increase the Valium  because it can cause fatigue and not to abruptly stop it either    social anxiety disorder; mostly stays at home because of his heart condition continue Paxil  and supportive therapy including counselor   ADHD; will keep off Adderall considering his history, medical comorbidity as above work on time management  Reviewed medication questions addressed risk discussed follow-up in 1-1/2 to 2 months or earlier if needed refill sent if due patient is following up with cardiology and is waiting for an appointment for [facility] cardiology   Collaboration of Care: Other read Dr. Melba referral and reviewed notes  Patient/Guardian was advised Release of Information must be obtained prior to any record release in order to collaborate their care with an outside provider. Patient/Guardian was advised if they have not already done so to contact the registration department to sign all necessary forms in order for us  to release information regarding their care.   Consent: Patient/Guardian gives verbal consent for treatment and assignment of benefits for services provided during this visit. Patient/Guardian expressed understanding and agreed to proceed.   Jackey Flight, MD 9/3/202510:38 AM

## 2024-06-09 ENCOUNTER — Ambulatory Visit (HOSPITAL_COMMUNITY): Admitting: Licensed Clinical Social Worker

## 2024-06-09 DIAGNOSIS — F410 Panic disorder [episodic paroxysmal anxiety] without agoraphobia: Secondary | ICD-10-CM | POA: Diagnosis not present

## 2024-06-09 NOTE — Progress Notes (Unsigned)
 THERAPIST PROGRESS NOTE  Session Time: 2:08 pm-3:00 pm  Type of Therapy: Individual Therapy  Treatment Goals addressed: Rylend will manage anxiety as evidenced by increasing physical wellness, identify and challenging anxious thoughts, regulate physical and cognitive reactions, increase zest for life, and improve sleep for 5 out of 7 days for 60 days   Session Goal: identify values which influence choices and help challenge anxious thoughts  Behavior: Patient noted that he has felt the same. He has been feeling fatigued. He had conversation with her doctor about feeling like he was being pushed off to [facility] and/or more assessment is needed. He noted the doctor apologized that they made him feel that way and patient feels like the relationship is stronger now.   Patient was oriented x4 (person, place, situation, and time) . Patient was  Anxious. Patient was Casual. Patient made minimal progress on his goals at this time.   Interventions:  Therapist used CBT interventions to challenge patient's anxious thoughts by identifying his person values.    Response: Patient was able to verbalize his values including God, family, friends, trust, loving/caring, life, health, house, team of providers, wife and daughter, and peace. Patient will identify the values that identify his decisions, and choose his values over his anxieties.   Patient engaged in session. Patient responded well to interventions. Patient continues to meet criteria for Panic disorder  Patient will continue in outpatient therapy due to being the least restrictive service to meet his needs.   Plan: Patient will return in 2-4 weeks.  Patient will continue to identify values, and use values to challenge anxious thoughts.    Suicidal/Homicidal:  No without intent/plan   Recent stressful life event  Protective Factors: responsibility to others (children, family) and hope for the future  Diagnosis: Axis I: Panic disorder    Collaboration  of Care: Psychiatrist AEB Dr. Jackey Flight  Patient/Guardian was advised Release of Information must be obtained prior to any record release in order to collaborate their care with an outside provider. Patient/Guardian was advised if they have not already done so to contact the registration department to sign all necessary forms in order for us  to release information regarding their care.   Consent: Patient/Guardian gives verbal consent for treatment and assignment of benefits for services provided during this visit. Patient/Guardian expressed understanding and agreed to proceed.

## 2024-06-16 ENCOUNTER — Ambulatory Visit (HOSPITAL_COMMUNITY): Admitting: Licensed Clinical Social Worker

## 2024-06-21 ENCOUNTER — Other Ambulatory Visit (HOSPITAL_COMMUNITY): Payer: Self-pay | Admitting: Psychiatry

## 2024-06-24 ENCOUNTER — Ambulatory Visit (INDEPENDENT_AMBULATORY_CARE_PROVIDER_SITE_OTHER): Admitting: Licensed Clinical Social Worker

## 2024-06-24 DIAGNOSIS — F410 Panic disorder [episodic paroxysmal anxiety] without agoraphobia: Secondary | ICD-10-CM | POA: Diagnosis not present

## 2024-06-24 NOTE — Progress Notes (Signed)
 THERAPIST PROGRESS NOTE  Session Time: 10:05 am-10:45 am  Type of Therapy: Individual Therapy  Treatment Goals addressed: Agostino will manage anxiety as evidenced by increasing physical wellness, identify and challenging anxious thoughts, regulate physical and cognitive reactions, increase zest for life, and improve sleep for 5 out of 7 days for 60 days   Session Goal: Mindfulness, acceptance  Behavior: Patient reported that his anxiety and depression has increased. He reports that he wants to go ahead and get his pace maker. He also feels like he is not doing enough to show up for his wife, and daughter.   Patient was oriented x4 (person, place, situation, and time) . Patient was  Anxious. Patient was Casual. Patient made minimal progress on his goals at this time.   Interventions:  Therapist used Mindfulness based CBT to identify thoughts to increase acceptance as well as mindfulness to manage thoughts and feelings.    Response: Patient understood that two seemingly opposing thoughts can be true at the same time such as a married couple needing to be united but also that each person in the marriage needs individuality. He recognizes he is doing the best he can but also is not as engaged with his family as he would like. Patient engaged in and enjoyed mindfulness.   Patient engaged in session. Patient responded well to interventions. Patient continues to meet criteria for Panic disorder  Patient will continue in outpatient therapy due to being the least restrictive service to meet his needs.   Plan: Patient will return in 2-4 weeks.  Patient will practice mindfulness in his daily activities such as being outdoors and using his senses to be present, and he will have more formal practice including brief, guided meditations.    Suicidal/Homicidal:  No without intent/plan   Recent stressful life event  Protective Factors: responsibility to others (children, family) and hope for the  future  Collaboration of Care: Psychiatrist AEB Dr. Jackey Flight  Patient/Guardian was advised Release of Information must be obtained prior to any record release in order to collaborate their care with an outside provider. Patient/Guardian was advised if they have not already done so to contact the registration department to sign all necessary forms in order for us  to release information regarding their care.   Consent: Patient/Guardian gives verbal consent for treatment and assignment of benefits for services provided during this visit. Patient/Guardian expressed understanding and agreed to proceed.

## 2024-07-05 ENCOUNTER — Ambulatory Visit (INDEPENDENT_AMBULATORY_CARE_PROVIDER_SITE_OTHER): Admitting: Licensed Clinical Social Worker

## 2024-07-05 DIAGNOSIS — F410 Panic disorder [episodic paroxysmal anxiety] without agoraphobia: Secondary | ICD-10-CM | POA: Diagnosis not present

## 2024-07-05 NOTE — Progress Notes (Unsigned)
 THERAPIST PROGRESS NOTE  Session Time:3:04 pm-  Type of Therapy: Individual Therapy  Treatment Goals addressed: Dieter will manage anxiety as evidenced by increasing physical wellness, identify and challenging anxious thoughts, regulate physical and cognitive reactions, increase zest for life, and improve sleep for 5 out of 7 days for 60 days   Session Goal: Mindfulness, acceptance  Behavior:  Patient was oriented x4 (person, place, situation, and time) . Patient was  Anxious. Patient was Casual. Patient made minimal progress on his goals at this time.   Interventions:     Response:   Patient engaged in session. Patient responded well to interventions. Patient continues to meet criteria for Panic disorder  Patient will continue in outpatient therapy due to being the least restrictive service to meet his needs.   Plan: Patient will return in 2-4 weeks.     Suicidal/Homicidal:  No without intent/plan   Recent stressful life event  Protective Factors: responsibility to others (children, family) and hope for the future  Collaboration of Care: Psychiatrist AEB Dr. Jackey Flight  Patient/Guardian was advised Release of Information must be obtained prior to any record release in order to collaborate their care with an outside provider. Patient/Guardian was advised if they have not already done so to contact the registration department to sign all necessary forms in order for us  to release information regarding their care.   Consent: Patient/Guardian gives verbal consent for treatment and assignment of benefits for services provided during this visit. Patient/Guardian expressed understanding and agreed to proceed.

## 2024-07-12 ENCOUNTER — Ambulatory Visit (INDEPENDENT_AMBULATORY_CARE_PROVIDER_SITE_OTHER): Admitting: Licensed Clinical Social Worker

## 2024-07-12 DIAGNOSIS — F410 Panic disorder [episodic paroxysmal anxiety] without agoraphobia: Secondary | ICD-10-CM | POA: Diagnosis not present

## 2024-07-12 NOTE — Progress Notes (Unsigned)
 THERAPIST PROGRESS NOTE  Session Time:3:04 pm-  Type of Therapy: Individual Therapy  Treatment Goals addressed: Dieter will manage anxiety as evidenced by increasing physical wellness, identify and challenging anxious thoughts, regulate physical and cognitive reactions, increase zest for life, and improve sleep for 5 out of 7 days for 60 days   Session Goal: Mindfulness, acceptance  Behavior:  Patient was oriented x4 (person, place, situation, and time) . Patient was  Anxious. Patient was Casual. Patient made minimal progress on his goals at this time.   Interventions:     Response:   Patient engaged in session. Patient responded well to interventions. Patient continues to meet criteria for Panic disorder  Patient will continue in outpatient therapy due to being the least restrictive service to meet his needs.   Plan: Patient will return in 2-4 weeks.     Suicidal/Homicidal:  No without intent/plan   Recent stressful life event  Protective Factors: responsibility to others (children, family) and hope for the future  Collaboration of Care: Psychiatrist AEB Dr. Jackey Flight  Patient/Guardian was advised Release of Information must be obtained prior to any record release in order to collaborate their care with an outside provider. Patient/Guardian was advised if they have not already done so to contact the registration department to sign all necessary forms in order for us  to release information regarding their care.   Consent: Patient/Guardian gives verbal consent for treatment and assignment of benefits for services provided during this visit. Patient/Guardian expressed understanding and agreed to proceed.

## 2024-07-18 ENCOUNTER — Ambulatory Visit (HOSPITAL_COMMUNITY): Admitting: Licensed Clinical Social Worker

## 2024-07-18 DIAGNOSIS — F410 Panic disorder [episodic paroxysmal anxiety] without agoraphobia: Secondary | ICD-10-CM

## 2024-07-18 DIAGNOSIS — F900 Attention-deficit hyperactivity disorder, predominantly inattentive type: Secondary | ICD-10-CM | POA: Diagnosis not present

## 2024-07-18 NOTE — Progress Notes (Signed)
 Virtual Visit via Video Note  I connected with Kyle Ward on 07/18/24 at 10:00 AM EDT by a video enabled telemedicine application and verified that I am speaking with the correct person using two identifiers.  Location: Patient: home Provider: office   I discussed the limitations of evaluation and management by telemedicine and the availability of in person appointments. The patient expressed understanding and agreed to proceed.   I discussed the assessment and treatment plan with the patient. The patient was provided an opportunity to ask questions and all were answered. The patient agreed with the plan and demonstrated an understanding of the instructions.   The patient was advised to call back or seek an in-person evaluation if the symptoms worsen or if the condition fails to improve as anticipated.  I provided 25 minutes of non-face-to-face time during this encounter.   Fonda Conroy, LCSW   THERAPIST PROGRESS NOTE  Session Time: 10:00 am-10:25 am  Type of Therapy: Individual Therapy  Treatment Goals addressed: Edrik will manage anxiety as evidenced by increasing physical wellness, identify and challenging anxious thoughts, regulate physical and cognitive reactions, increase zest for life, and improve sleep for 5 out of 7 days for 60 days   Session Goal: anxiety management,   Behavior: Patient was sick and switched to virtual. Patient noted that on one hand things were were the same but on the other hand things were better. He has his doctors appointment later this week and is feeling better that it is happening. Patient also noted that due to being sick he isolated from his inlaws while they were there. His parents are staying there but are leaving today.  Patient was oriented x4 (person, place, situation, and time) . Patient was  Anxious. Patient was Casual. Patient made minimal progress on his goals at this time.   Interventions:  Therapist used CBT interventions of  cognitive restructuring to manage anxiety.    Response: Patient feels like he can control what is within his control. He is going to come up with questions to ask his doctors during the consult for a pacemaker. He feels like this will give him control and ownership rather than feeling like he has no input. Patient was also able to identify what he is doing well such as communicate with his wife or spend time with his daughter.   Patient engaged in session. Patient responded well to interventions. 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   Plan: Patient will return in 2-4 weeks.     Suicidal/Homicidal:  No without intent/plan   Recent stressful life event  Protective Factors: responsibility to others (children, family) and hope for the future  Collaboration of Care: Psychiatrist AEB Dr. Jackey Flight  Patient/Guardian was advised Release of Information must be obtained prior to any record release in order to collaborate their care with an outside provider. Patient/Guardian was advised if they have not already done so to contact the registration department to sign all necessary forms in order for us  to release information regarding their care.   Consent: Patient/Guardian gives verbal consent for treatment and assignment of benefits for services provided during this visit. Patient/Guardian expressed understanding and agreed to proceed.

## 2024-07-25 ENCOUNTER — Ambulatory Visit (HOSPITAL_COMMUNITY): Admitting: Licensed Clinical Social Worker

## 2024-07-27 ENCOUNTER — Telehealth (HOSPITAL_COMMUNITY): Payer: Self-pay | Admitting: Psychiatry

## 2024-07-27 NOTE — Telephone Encounter (Signed)
 Patient called requesting provider be notified that he is scheduled for a pacemaker placement on 08/02/2024 and possibly an electrophysiology study with subsequent ablation if any findings.   Patient also stated additional medical records are needed for his disability/FMLA claim that were not included in previous submission. Referred to medical record department to resolve this issue.

## 2024-07-29 ENCOUNTER — Other Ambulatory Visit (HOSPITAL_COMMUNITY): Payer: Self-pay | Admitting: Psychiatry

## 2024-08-03 ENCOUNTER — Ambulatory Visit (HOSPITAL_COMMUNITY): Admitting: Licensed Clinical Social Worker

## 2024-08-08 ENCOUNTER — Telehealth (HOSPITAL_COMMUNITY): Payer: Self-pay | Admitting: Psychiatry

## 2024-08-08 MED ORDER — PAROXETINE HCL 30 MG PO TABS: 30.0000 mg | ORAL_TABLET | Freq: Every day | ORAL | 1 refills | Status: DC

## 2024-08-08 NOTE — Telephone Encounter (Signed)
 Received fax from patient's pharmacy requesting refill of PARoxetine  (PAXIL ) 30 MG tablet  .   [id number] - [place], [place] - [address] AT [facility] OF [address] (Ph: [phone])   Last ordered: 05/19/2024 - 30 tablets with 1 refill Last visit: 06/08/2024 Next visit: 08/17/2024

## 2024-08-17 ENCOUNTER — Telehealth (INDEPENDENT_AMBULATORY_CARE_PROVIDER_SITE_OTHER): Admitting: Psychiatry

## 2024-08-17 ENCOUNTER — Telehealth (HOSPITAL_COMMUNITY): Admitting: Psychiatry

## 2024-08-17 ENCOUNTER — Encounter (HOSPITAL_COMMUNITY): Payer: Self-pay | Admitting: Psychiatry

## 2024-08-17 DIAGNOSIS — F410 Panic disorder [episodic paroxysmal anxiety] without agoraphobia: Secondary | ICD-10-CM

## 2024-08-17 DIAGNOSIS — F418 Other specified anxiety disorders: Secondary | ICD-10-CM

## 2024-08-17 DIAGNOSIS — F900 Attention-deficit hyperactivity disorder, predominantly inattentive type: Secondary | ICD-10-CM | POA: Diagnosis not present

## 2024-08-17 DIAGNOSIS — F4010 Social phobia, unspecified: Secondary | ICD-10-CM

## 2024-08-17 NOTE — Progress Notes (Signed)
 [facility] Follow up visit  Patient Identification: Kyle Ward MRN:  [id number] Date of Evaluation:  08/17/2024 Referral Source: Dr. Melba psychiatrist Chief Complaint:  anxiety, performance anxiety, inattention No chief complaint on file.  Visit Diagnosis:    ICD-10-CM   1. Panic disorder  F41.0     2. Social anxiety disorder  F40.10     3. Attention deficit hyperactivity disorder (ADHD), predominantly inattentive type  F90.0     4. Situational anxiety  F41.8     Virtual Visit via Video Note  I connected with Kyle Ward on 08/17/24 at 11:30 AM EST by a video enabled telemedicine application and verified that I am speaking with the correct person using two identifiers.  Location: Patient: parked car Provider: home office   I discussed the limitations of evaluation and management by telemedicine and the availability of in person appointments. The patient expressed understanding and agreed to proceed.      I discussed the assessment and treatment plan with the patient. The patient was provided an opportunity to ask questions and all were answered. The patient agreed with the plan and demonstrated an understanding of the instructions.   The patient was advised to call back or seek an in-person evaluation if the symptoms worsen or if the condition fails to improve as anticipated.  I provided 20 minutes of non-face-to-face time during this encounter.        History of Present Illness: Patient is a 33 years old currently married Caucasian male initially referred by psychiatrist Dr. Melba as he was  closing his practice diagnosed with ADD and anxiety condition He works as a dance movement psychotherapist.  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 He visited his PCP and  reviewed and recently put back on 10mg  as he felt he struggles without it in regard to attention   He does have fatigue.  His current job is going to be ending he is starting back on this job after the disability paperwork was filled.  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  We talked about fatigue in correspondence with Valium  and also Valium  can make a person feel foggy or inattentive.  He feels that he cannot take himself off from Valium  because it controls his anxiety he talked about Paxil  he wanted to taper that down but considering his overall condition being more stable at the current medication dose including Paxil  that would not be a good option.  As of now or taper down   Overall things at home are getting better financially he is looking forward to start the new job so that we can get more stability   Aggravating factors;performance anxiety, job stress, heart condition Modifying factors; dog, his family, marriage  Duration more than 6 years Severity improved and is back to work but may be changing his job  Past Psychiatric History: anxiety, adhd  Previous Psychotropic Medications: Yes   Substance Abuse History in the last 12 months:  Yes.    Consequences of Substance Abuse: Discussed effect of alcohol use to depression, anxiety and medications, judjement  Past Medical History:  Past Medical History:  Diagnosis Date   Anxiety     Past Surgical History:  Procedure Laterality Date   ELBOW SURGERY      Family Psychiatric History: Grand PA: Bipolar  Family History:  Family History  Problem Relation Age of Onset   Liver disease Father    Breast cancer Mother    Diabetes Mother    Stroke Maternal Grandmother    Bipolar disorder Paternal Grandfather     Social History:   Social History   Socioeconomic History   Marital status: Married    Spouse name: Not on file   Number of children: Not on file   Years of  education: Not on file   Highest education level: Not on file  Occupational History   Not on file  Tobacco Use   Smoking status: Former   Smokeless tobacco: Never   Tobacco comments:    Smoked during college.    Substance and Sexual Activity   Alcohol use: Yes    Alcohol/week: 0.0 standard drinks of alcohol    Comment: 2 servings of hard cider weekly   Drug use: No   Sexual activity: Not on file  Other Topics Concern   Not on file  Social History Narrative   Engaged to be married.     Social Drivers of Corporate Investment Banker Strain: Low Risk  (04/14/2024)   Received from [facility]   Overall Financial Resource Strain (CARDIA)    Difficulty of Paying Living Expenses: Not hard at all  Food Insecurity: No Food Insecurity (04/14/2024)   Received from [facility]   Hunger Vital Sign    Within the past 12 months, you worried that your food would run out before you got the money to buy more.: Never true    Within the past 12 months, the food you bought just didn't last and you didn't have money to get more.: Never true  Transportation Needs: No Transportation Needs (04/14/2024)   [facility]    Lack of Transportation (Medical): No    Lack of Transportation (Non-Medical): No  Physical Activity: Not on file  Stress: Not on file  Social Connections: Unknown (03/11/2023)   [facility] from [facility]   Social Network    Social Network: Not on file    Additional Social History: grew up with parents, good no abuse, graduated college  Allergies:  No Known Allergies  Metabolic Disorder Labs: No results found for: HGBA1C, MPG No results found for: PROLACTIN No results found for: CHOL, TRIG, HDL, CHOLHDL, VLDL, LDLCALC Lab Results  Component Value Date   TSH 1.048 05/17/2015    Therapeutic Level Labs: No results found for: LITHIUM No results found for: CBMZ No results found for: VALPROATE  Current  Medications: Current Outpatient Medications  Medication Sig Dispense Refill   amoxicillin -clavulanate (AUGMENTIN ) 875-125 MG tablet Take 1 tablet by mouth 2 (two) times daily. 20 tablet 0   diazepam  (VALIUM ) 10 MG tablet TAKE 1 TABLET (10 MG TOTAL) BY MOUTH DAILY AS NEEDED FOR ANXIETY. 30 tablet 0   meloxicam  (MOBIC ) 15 MG tablet Take 0.5-1 tablets (7.5-15 mg total) by mouth daily as needed. (Patient not taking: Reported on 05/24/2020) 30 tablet 2   PARoxetine  (PAXIL ) 30 MG tablet Take 1 tablet (30 mg total) by mouth daily. 30 tablet 1   ZEPBOUND 10 MG/0.5ML Pen Inject 10 mg into the skin once a week.     No current facility-administered medications for this visit.     Psychiatric Specialty Exam: Review of Systems  Cardiovascular:  Negative for chest pain.  Skin:  Negative for rash.  Neurological:  Negative for tremors.  Psychiatric/Behavioral:  Negative for agitation.     There were no vitals taken for this visit.There is no height or weight on file to calculate BMI.  General Appearance: Fairly Groomed  Eye Contact:  Good  Speech:  Slow  Volume:  Decreased  Mood: Fair  Affect:  Congruent  Thought Process:  Goal Directed  Orientation:  Full (Time, Place, and Person)  Thought Content:  Rumination  Suicidal Thoughts:  No  Homicidal Thoughts:  No  Memory:  Immediate;   Fair  Judgement:  Fair  Insight:  Fair  Psychomotor Activity:  Normal  Concentration:  Concentration: Fair  Recall:  [employer] of Knowledge:Fair  Language: Good  Akathisia:  No  Handed:    AIMS (if indicated):  not done  Assets:  Desire for Improvement Financial Resources/Insurance Housing  ADL's:  Intact  Cognition: WNL  Sleep:  Fair   Screenings: GAD-7    Advertising Copywriter from 05/13/2024 in [facility] at [facility]  Total GAD-7 Score 17   PHQ2-9    Flowsheet Row Counselor from 05/13/2024 in [facility] at [facility] Counselor from 04/03/2023 in [facility] at [facility] Office Visit from 05/02/2022 in [facility] at [facility] Office Visit from 10/21/2019 in [facility] at [facility] Total Score 2 2 0 0  PHQ-9 Total Score 19 7 -- --   Flowsheet Row Video Visit from 08/17/2024 in [facility] at [facility] Video Visit from 06/08/2024 in [facility] at [facility] Video Visit from 04/18/2024 in [facility] at [facility]  C-SSRS RISK CATEGORY No Risk No Risk No Risk    Assessment and Plan: as follows Prior documentation reviewed   Panic disorder; handling them better and less sporadic.  Continue Paxil  30 mg he is also on Valium  cautioned about Valium  affect and tiredness. social anxiety disorder; mostly works and stays at home things are getting better at home.  Continue counseling if possible and medication avoiding Paxil  and Valium     ADHD; primary care has restarted back on Adderall he can continue to monitor his symptoms and I will let the primary care to continue Adderall considering his heart condition and being on a wireless pacemaker  I cautioned about the Adderall effect to anxiety and stimulating effects Reviewed medication questions addressed continue follow with the primary care physician and [facility] and regarding to comorbid medical condition  Questions addressed refill of Paxil  and Valium  will be sent if due.  Follow-up in 2 to 3 months or earlier if needed   Collaboration of Care: Other read Dr. Melba referral and reviewed notes  Patient/Guardian was advised Release of Information must be obtained prior to any record release in order to collaborate their care with an outside provider. Patient/Guardian was advised if they have not already done  so to contact the registration department to sign all necessary forms in order for us  to release information regarding their care.   Consent: Patient/Guardian gives verbal consent for treatment and assignment of benefits for services provided during this visit. Patient/Guardian expressed understanding and agreed to proceed.   Jackey Flight, MD 11/12/202511:46 AM

## 2024-08-18 ENCOUNTER — Ambulatory Visit (HOSPITAL_COMMUNITY): Admitting: Licensed Clinical Social Worker

## 2024-08-23 ENCOUNTER — Other Ambulatory Visit (HOSPITAL_COMMUNITY): Payer: Self-pay | Admitting: Psychiatry

## 2024-09-13 ENCOUNTER — Other Ambulatory Visit (HOSPITAL_COMMUNITY): Payer: Self-pay | Admitting: Psychiatry

## 2024-09-14 ENCOUNTER — Other Ambulatory Visit (HOSPITAL_COMMUNITY): Payer: Self-pay | Admitting: Psychiatry

## 2024-10-26 ENCOUNTER — Other Ambulatory Visit (HOSPITAL_COMMUNITY): Payer: Self-pay | Admitting: Psychiatry

## 2024-11-16 ENCOUNTER — Telehealth (HOSPITAL_COMMUNITY): Admitting: Psychiatry
